# Patient Record
Sex: Female | Born: 1975 | Race: White | Hispanic: No | Marital: Single | Smoking: Current every day smoker
Health system: Southern US, Community
[De-identification: ages and names within clinical notes are randomized; demographics above are authoritative.]

## PROBLEM LIST (undated history)

## (undated) DIAGNOSIS — R569 Unspecified convulsions: Secondary | ICD-10-CM

## (undated) DIAGNOSIS — G809 Cerebral palsy, unspecified: Secondary | ICD-10-CM

---

## 2010-02-20 DIAGNOSIS — M2010 Hallux valgus (acquired), unspecified foot: Secondary | ICD-10-CM | POA: Insufficient documentation

## 2010-06-03 DIAGNOSIS — M545 Low back pain, unspecified: Secondary | ICD-10-CM | POA: Insufficient documentation

## 2010-10-24 DIAGNOSIS — R251 Tremor, unspecified: Secondary | ICD-10-CM | POA: Insufficient documentation

## 2010-11-06 DIAGNOSIS — Z889 Allergy status to unspecified drugs, medicaments and biological substances status: Secondary | ICD-10-CM | POA: Insufficient documentation

## 2010-11-06 DIAGNOSIS — M79671 Pain in right foot: Secondary | ICD-10-CM | POA: Insufficient documentation

## 2012-03-01 DIAGNOSIS — M47819 Spondylosis without myelopathy or radiculopathy, site unspecified: Secondary | ICD-10-CM | POA: Insufficient documentation

## 2012-07-02 DIAGNOSIS — R259 Unspecified abnormal involuntary movements: Secondary | ICD-10-CM | POA: Insufficient documentation

## 2012-07-02 DIAGNOSIS — G809 Cerebral palsy, unspecified: Secondary | ICD-10-CM | POA: Insufficient documentation

## 2012-07-02 DIAGNOSIS — M7710 Lateral epicondylitis, unspecified elbow: Secondary | ICD-10-CM | POA: Insufficient documentation

## 2012-07-02 DIAGNOSIS — M79609 Pain in unspecified limb: Secondary | ICD-10-CM | POA: Insufficient documentation

## 2012-07-02 DIAGNOSIS — M2550 Pain in unspecified joint: Secondary | ICD-10-CM | POA: Insufficient documentation

## 2012-07-02 DIAGNOSIS — M62838 Other muscle spasm: Secondary | ICD-10-CM | POA: Insufficient documentation

## 2013-03-01 DIAGNOSIS — Z3042 Encounter for surveillance of injectable contraceptive: Secondary | ICD-10-CM | POA: Insufficient documentation

## 2013-08-15 DIAGNOSIS — M25511 Pain in right shoulder: Secondary | ICD-10-CM | POA: Insufficient documentation

## 2013-12-05 DIAGNOSIS — Z5181 Encounter for therapeutic drug level monitoring: Secondary | ICD-10-CM | POA: Insufficient documentation

## 2013-12-05 DIAGNOSIS — Z79891 Long term (current) use of opiate analgesic: Secondary | ICD-10-CM

## 2013-12-05 DIAGNOSIS — F17200 Nicotine dependence, unspecified, uncomplicated: Secondary | ICD-10-CM | POA: Insufficient documentation

## 2014-03-15 DIAGNOSIS — M7581 Other shoulder lesions, right shoulder: Secondary | ICD-10-CM | POA: Insufficient documentation

## 2014-10-29 DIAGNOSIS — M2392 Unspecified internal derangement of left knee: Secondary | ICD-10-CM | POA: Insufficient documentation

## 2015-10-02 ENCOUNTER — Emergency Department (HOSPITAL_COMMUNITY)
Admission: EM | Admit: 2015-10-02 | Discharge: 2015-10-03 | Disposition: A | Discharge status: Home / Self Care | Payer: Medicare Other | Attending: Emergency Medicine | Admitting: Emergency Medicine

## 2015-10-02 ENCOUNTER — Emergency Department (HOSPITAL_COMMUNITY): Payer: Medicare Other

## 2015-10-02 ENCOUNTER — Encounter (HOSPITAL_COMMUNITY): Payer: Self-pay | Admitting: *Deleted

## 2015-10-02 DIAGNOSIS — M546 Pain in thoracic spine: Secondary | ICD-10-CM | POA: Diagnosis not present

## 2015-10-02 DIAGNOSIS — G809 Cerebral palsy, unspecified: Secondary | ICD-10-CM | POA: Diagnosis not present

## 2015-10-02 DIAGNOSIS — M79671 Pain in right foot: Secondary | ICD-10-CM | POA: Diagnosis not present

## 2015-10-02 HISTORY — DX: Unspecified convulsions: R56.9

## 2015-10-02 HISTORY — DX: Cerebral palsy, unspecified: G80.9

## 2015-10-02 MED ORDER — HYDROCODONE-ACETAMINOPHEN 5-325 MG PO TABS
1.0000 | ORAL_TABLET | Freq: Once | ORAL | Status: AC
  Administered 2015-10-02: 1 via ORAL
  Filled 2015-10-02: qty 1

## 2015-10-02 MED ORDER — HYDROCODONE-ACETAMINOPHEN 5-325 MG PO TABS: 1.0000 | ORAL_TABLET | ORAL | 0 refills | Status: DC | PRN

## 2015-10-02 NOTE — ED Provider Notes (Signed)
[facility]-EMERGENCY DEPT Provider Note   CSN: [id number] Arrival date & time: 10/02/15  2039     History   Chief Complaint Chief Complaint  Patient presents with  . Foot Pain  . Shoulder Pain   HPI  Sharon Hooper is an 40 y.o. female with history of cerebral palsy who presents to the ED for evaluation of right foot pain and left shoulder pain. She states the pain started today. She feels like the lateral dorsum of her foot is swollen. The pain is worse with weight bearing. Denies known injury or trauma. Denies numbness, weakness, or tingling. She states she does walk around a lot. Her shoulder hurts from her neck to her mid back. Also no known trauma. No fever or chills. She has taken her regularly scheduled baclofen and meloxicam with no relief.   Past Medical History:  Diagnosis Date  . Cerebral palsy (HCC)   . Seizures (HCC)     There are no active problems to display for this patient.   History reviewed. No pertinent surgical history.  OB History    No data available       Home Medications    Prior to Admission medications   Not on File    Family History No family history on file.  Social History Social History  Substance Use Topics  . Smoking status: Never Smoker  . Smokeless tobacco: Never Used  . Alcohol use No     Allergies   Penicillins and Tramadol   Review of Systems Review of Systems 10 Systems reviewed and are negative for acute change except as noted in the HPI.   Physical Exam Updated Vital Signs BP 110/72 (BP Location: Right Arm)   Pulse 70   Temp 98.5 F (36.9 C) (Oral)   Resp 18   SpO2 98%   Physical Exam  Constitutional: She is oriented to person, place, and time. No distress.  HENT:  Head: Atraumatic.  Right Ear: External ear normal.  Left Ear: External ear normal.  Nose: Nose normal.  Eyes: Conjunctivae are normal. No scleral icterus.  Cardiovascular: Normal rate and regular rhythm.   Pulmonary/Chest: Effort normal. No  respiratory distress.  Abdominal: She exhibits no distension.  Musculoskeletal:  Minimal lateral dorsal tenderness of right foot. No edema noted. FROM of ankle. 2+ DP Left trapezial tenderness No midline neck or back tenderness FROM of neck and BUE  Neurological: She is alert and oriented to person, place, and time.  Skin: Skin is warm and dry. She is not diaphoretic.  Psychiatric: She has a normal mood and affect. Her behavior is normal.  Nursing note and vitals reviewed.    ED Treatments / Results  Labs (all labs ordered are listed, but only abnormal results are displayed) Labs Reviewed - No data to display  EKG  EKG Interpretation None       Radiology Dg Foot Complete Right  Result Date: 10/02/2015 CLINICAL DATA:  Anterior foot pain for 48 hours. Lateral foot swelling. Prior fracture of the great toe. EXAM: RIGHT FOOT COMPLETE - 3+ VIEW COMPARISON:  None. FINDINGS: There is arthropathy at the first MTP joint which could conceivably be posttraumatic, but which has a pencil in cup appearance they can be associated with psoriatic arthropathy. No malalignment at the Lisfranc joint identified. Small bone island in the calcaneus. No additional bony abnormality identified. No foreign body or gas in the soft tissues noted. IMPRESSION: 1. Scalloped appearance of the base the proximal phalanx great toe with a  tapered and deformed appearance of the head of the first metatarsal. This could be posttraumatic although psoriatic arthropathy could cause a similar appearance. 2. No additional significant bony findings. No foreign body or gas in the soft tissues to further explain the patient's anterior foot pain. Electronically Signed   By: Gaylyn RongWalter  Liebkemann M.D.   On: 10/02/2015 23:20    Procedures Procedures (including critical care time)  Medications Ordered in ED Medications  HYDROcodone-acetaminophen (NORCO/VICODIN) 5-325 MG per tablet 1 tablet (1 tablet Oral Given 10/02/15 2337)      Initial Impression / Assessment and Plan / ED Course  I have reviewed the triage vital signs and the nursing notes.  Pertinent labs & imaging results that were available during my care of the patient were reviewed by me and considered in my medical decision making (see chart for details).  Clinical Course    Discussed XR findings with pt. Will give rx for short course of norco. Pt takes baclofen and mobic already. Instructed f/u with ortho. ER return precautions given. The patient appears reasonably screened and/or stabilized for discharge and I doubt any other medical condition or other [facility] requiring further screening, evaluation, or treatment in the ED at this time prior to discharge.   Final Clinical Impressions(s) / ED Diagnoses   Final diagnoses:  Right foot pain  Left-sided thoracic back pain    New Prescriptions Discharge Medication List as of 10/02/2015 11:27 PM    START taking these medications   Details  HYDROcodone-acetaminophen (NORCO/VICODIN) 5-325 MG tablet Take 1 tablet by mouth every 4 (four) hours as needed for severe pain., Starting Wed 10/02/2015, Print         Carlene CoriaSerena Y Allana Shrestha, PA-C 10/03/15 [id number]    Benjiman CoreNathan Pickering, MD 10/03/15 [phone]

## 2015-10-02 NOTE — ED Triage Notes (Signed)
Pt complains of left shoulder, right and right dorsal foot pain since yesterday. Pt denies injury, states she woke with the pain yesterday.

## 2015-10-02 NOTE — Discharge Instructions (Signed)
Take Norco as needed for severe pain. Take your home medications as prescribed. Follow up with orthopedics as soon as possible.

## 2015-12-17 DIAGNOSIS — H52222 Regular astigmatism, left eye: Secondary | ICD-10-CM | POA: Diagnosis not present

## 2015-12-17 DIAGNOSIS — H04123 Dry eye syndrome of bilateral lacrimal glands: Secondary | ICD-10-CM | POA: Diagnosis not present

## 2015-12-18 ENCOUNTER — Encounter: Payer: Self-pay | Admitting: Family Medicine

## 2015-12-18 ENCOUNTER — Ambulatory Visit (INDEPENDENT_AMBULATORY_CARE_PROVIDER_SITE_OTHER): Payer: Medicare Other | Admitting: Family Medicine

## 2015-12-18 VITALS — BP 110/70 | HR 68 | Temp 98.3°F | Resp 18 | Ht 60.0 in | Wt 147.6 lb

## 2015-12-18 DIAGNOSIS — B070 Plantar wart: Secondary | ICD-10-CM | POA: Diagnosis not present

## 2015-12-18 DIAGNOSIS — G8929 Other chronic pain: Secondary | ICD-10-CM

## 2015-12-18 DIAGNOSIS — Z3040 Encounter for surveillance of contraceptives, unspecified: Secondary | ICD-10-CM

## 2015-12-18 DIAGNOSIS — L84 Corns and callosities: Secondary | ICD-10-CM | POA: Diagnosis not present

## 2015-12-18 DIAGNOSIS — Z79891 Long term (current) use of opiate analgesic: Secondary | ICD-10-CM | POA: Diagnosis not present

## 2015-12-18 DIAGNOSIS — G809 Cerebral palsy, unspecified: Secondary | ICD-10-CM

## 2015-12-18 DIAGNOSIS — R6889 Other general symptoms and signs: Secondary | ICD-10-CM | POA: Diagnosis not present

## 2015-12-18 DIAGNOSIS — Z23 Encounter for immunization: Secondary | ICD-10-CM | POA: Diagnosis not present

## 2015-12-18 DIAGNOSIS — M7581 Other shoulder lesions, right shoulder: Secondary | ICD-10-CM | POA: Diagnosis not present

## 2015-12-18 DIAGNOSIS — Z3042 Encounter for surveillance of injectable contraceptive: Secondary | ICD-10-CM

## 2015-12-18 DIAGNOSIS — R251 Tremor, unspecified: Secondary | ICD-10-CM

## 2015-12-18 DIAGNOSIS — Z5181 Encounter for therapeutic drug level monitoring: Secondary | ICD-10-CM

## 2015-12-18 DIAGNOSIS — F17200 Nicotine dependence, unspecified, uncomplicated: Secondary | ICD-10-CM | POA: Diagnosis not present

## 2015-12-18 LAB — BASIC METABOLIC PANEL
BUN: 12 mg/dL (ref 7–25)
CHLORIDE: 108 mmol/L (ref 98–110)
CO2: 21 mmol/L (ref 20–31)
Calcium: 8.4 mg/dL — ABNORMAL LOW (ref 8.6–10.2)
Creat: 0.66 mg/dL (ref 0.50–1.10)
GLUCOSE: 81 mg/dL (ref 65–99)
POTASSIUM: 4.1 mmol/L (ref 3.5–5.3)
Sodium: 137 mmol/L (ref 135–146)

## 2015-12-18 LAB — CBC
HEMATOCRIT: 35.6 % (ref 35.0–45.0)
HEMOGLOBIN: 11.9 g/dL (ref 11.7–15.5)
MCH: 30.3 pg (ref 27.0–33.0)
MCHC: 33.4 g/dL (ref 32.0–36.0)
MCV: 90.6 fL (ref 80.0–100.0)
MPV: 11.2 fL (ref 7.5–12.5)
Platelets: 163 10*3/uL (ref 140–400)
RBC: 3.93 MIL/uL (ref 3.80–5.10)
RDW: 12.9 % (ref 11.0–15.0)
WBC: 5 10*3/uL (ref 3.8–10.8)

## 2015-12-18 LAB — TSH: TSH: 1.6 mIU/L

## 2015-12-18 MED ORDER — FLUCONAZOLE 150 MG PO TABS: 150.0000 mg | ORAL_TABLET | ORAL | 0 refills | Status: DC

## 2015-12-18 MED ORDER — BACLOFEN 10 MG PO TABS
10.0000 mg | ORAL_TABLET | Freq: Three times a day (TID) | ORAL | 1 refills | Status: DC
Start: 2015-12-18 — End: 2016-03-26

## 2015-12-18 MED ORDER — MELOXICAM 15 MG PO TABS: 15.0000 mg | ORAL_TABLET | Freq: Every day | ORAL | 1 refills | Status: DC

## 2015-12-18 MED ORDER — PROPRANOLOL HCL 80 MG PO TABS: 80.0000 mg | ORAL_TABLET | Freq: Every day | ORAL | 1 refills | Status: DC

## 2015-12-18 NOTE — Patient Instructions (Signed)
     IF you received an x-ray today, you will receive an invoice from [facility]. Please contact [facility] at [phone] with questions or concerns regarding your invoice.   IF you received labwork today, you will receive an invoice from [facility]. Please contact [facility] at [phone] with questions or concerns regarding your invoice.   Our billing staff will not be able to assist you with questions regarding bills from these companies.  You will be contacted with the lab results as soon as they are available. The fastest way to get your results is to activate your My Chart account. Instructions are located on the last page of this paperwork. If you have not heard from us regarding the results in 2 weeks, please contact this office.      

## 2015-12-18 NOTE — Progress Notes (Signed)
Chief Complaint  Patient presents with  . Medication Refill    Baclofen, propranolol, meloxicam    HPI   Pt reports that she smokes a pack of cigarettes a day for the past 21 years She reports that she is not ready to quit  She gets cigarettes by buying them or people offering She reports that she has been smoking since age 40  Cerebral Palsy and intention tremor She reports a chronic history of muscle spasms and multiple joint problems She was discharge from her previous pain clinic due to discrepancy in her urine screen She is currently taking baclofen and meloxicam for pain She would rate her pain 10/10 today Social support- she has a caregiver and a [employer] stream[employer] officer  Cold intolerance She reports that lately she has been feeling cold more than usual She reports that her tremors seems to be mild Her caregiver reports that the patient is always cold   Contraception She [facility] her [facility] She is up to date on her pap smear and std screenings   Past Medical History:  Diagnosis Date  . Cerebral palsy (HCC)   . Seizures (HCC)     Current Outpatient Prescriptions  Medication Sig Dispense Refill  . baclofen (LIORESAL) 10 MG tablet Take 1 tablet (10 mg total) by mouth 3 (three) times daily. 90 each 1  . meloxicam (MOBIC) 15 MG tablet Take 1 tablet (15 mg total) by mouth daily. 30 tablet 1  . propranolol (INDERAL) 80 MG tablet Take 1 tablet (80 mg total) by mouth daily. 90 tablet 1  . fluconazole (DIFLUCAN) 150 MG tablet Take 1 tablet (150 mg total) by mouth once a week. 2 tablet 0  . HYDROcodone-acetaminophen (NORCO/VICODIN) 5-325 MG tablet Take 1 tablet by mouth every 4 (four) hours as needed for severe pain. (Patient not taking: Reported on 12/18/2015) 10 tablet 0   No current facility-administered medications for this visit.     Allergies:  Allergies  Allergen Reactions  . Tramadol     Other reaction(s): Other Other reaction(s):  Other Per patient she states it counteracts with her meds//increases tremors  . Penicillins     Other reaction(s): VOMITING,NAUSEA    History reviewed. No pertinent surgical history.  Social History   Social History  . Marital status: Single    Spouse name: N/A  . Number of children: N/A  . Years of education: N/A   Social History Main Topics  . Smoking status: Current Every Day Smoker    Packs/day: 1.00    Years: 15.00    Types: Cigarettes  . Smokeless tobacco: Never Used  . Alcohol use 0.6 - 1.2 oz/week    1 - 2 Glasses of wine per week  . Drug use: No  . Sexual activity: Not Asked   Other Topics Concern  . None   Social History Narrative  . None    Review of Systems  Constitutional: Negative for chills and fever.  HENT: Negative for hearing loss and tinnitus.   Eyes: Negative for blurred vision and double vision.  Respiratory: Negative for cough and hemoptysis.   Cardiovascular: Negative for chest pain and palpitations.  Gastrointestinal: Negative for abdominal pain, nausea and vomiting.  Genitourinary: Negative for dysuria, frequency and urgency.  Musculoskeletal: Positive for back pain, falls and joint pain. Negative for neck pain.  Skin: Negative for itching and rash.  Neurological: Negative for dizziness, tingling and headaches.  Psychiatric/Behavioral: Negative for depression. The patient is not nervous/anxious.  Objective: Vitals:   12/18/15 0827  BP: 110/70  Pulse: 68  Resp: 18  Temp: 98.3 F (36.8 C)  TempSrc: Oral  SpO2: 99%  Weight: 147 lb 9.6 oz (67 kg)  Height: 5' (1.524 m)    Physical Exam  Constitutional: She is oriented to person, place, and time. She appears well-developed and well-nourished.  HENT:  Head: Normocephalic and atraumatic.  Right Ear: External ear normal.  Left Ear: External ear normal.  Mouth/Throat: Oropharynx is clear and moist.  Eyes: Conjunctivae and EOM are normal.  Neck: Normal range of motion. Neck  supple.  Cardiovascular: Normal rate, regular rhythm and normal heart sounds.   No murmur heard. Pulses:      Dorsalis pedis pulses are 2+ on the right side, and 2+ on the left side.       Posterior tibial pulses are 2+ on the right side, and 2+ on the left side.  Pulmonary/Chest: Effort normal and breath sounds normal. No respiratory distress. She has no wheezes.  Musculoskeletal:  Plantar wart on sole of right foot  Feet:  Right Foot:  Skin Integrity: Positive for callus. Negative for ulcer.  Left Foot:  Skin Integrity: Positive for callus. Negative for ulcer.  Neurological: She is alert and oriented to person, place, and time. She displays tremor and abnormal reflex. She exhibits abnormal muscle tone.  Reflex Scores:      Patellar reflexes are 3+ on the right side and 3+ on the left side. Skin: Skin is warm. Rash noted.  Hypopigmentation, discrete spots on arms, axillae, torso  Psychiatric: She has a normal mood and affect. Her behavior is normal. Judgment and thought content normal.    Assessment and Plan Sharon ClockKristina was seen today for medication refill.  Diagnoses and all orders for this visit:  Infantile cerebral palsy [facility](HCC)- discussed that she should continue her routine medication propranolol for tremors  Need for prophylactic vaccination and inoculation against influenza -     Flu Vaccine QUAD 36+ mos PF IM (Fluarix & Fluzone Quad PF)  Encounter for chronic pain management -     Ambulatory referral to Pain Clinic  Right rotator cuff tendonitis- continue meloxicam  Tobacco use disorder- advised smoking cessation Pt is not ready for change She is aware of the consequences  Intermittent tremor- discussed that we will check hemoglobin as anemia can worsen tremor -     CBC  Encounter for monitoring opioid maintenance therapy- referral to Pain Clinic Refilled Meloxicam today Did not refill opiate since she is not currently taking it Discussed that going without will  allow her to be restarted on a lower dose  Depo-Provera contraceptive status- continue Depo mgmt with [facility]  Need for Tdap vaccination- tdap given today  Cold intolerance- will screen for anemia or thyroid imbalance -     CBC -     Basic metabolic panel -     TSH  Callus of foot- since pt has spasticity she walks on the balls of her feet  Could benefit from some shoe inserts -     Ambulatory referral to Podiatry  Plantar wart- follow up with Podiatry, referral placed -     Ambulatory referral to Podiatry  Other orders -     Tdap vaccine greater than or equal to 7yo IM -     baclofen (LIORESAL) 10 MG tablet; Take 1 tablet (10 mg total) by mouth 3 (three) times daily. -     meloxicam (MOBIC) 15 MG tablet;  Take 1 tablet (15 mg total) by mouth daily. -     propranolol (INDERAL) 80 MG tablet; Take 1 tablet (80 mg total) by mouth daily. -     fluconazole (DIFLUCAN) 150 MG tablet; Take 1 tablet (150 mg total) by mouth once a week.     Aaban Griep A Butler Vegh

## 2015-12-30 ENCOUNTER — Ambulatory Visit (INDEPENDENT_AMBULATORY_CARE_PROVIDER_SITE_OTHER): Payer: Medicare Other

## 2015-12-30 ENCOUNTER — Ambulatory Visit (INDEPENDENT_AMBULATORY_CARE_PROVIDER_SITE_OTHER): Payer: Medicare Other | Admitting: Podiatry

## 2015-12-30 DIAGNOSIS — R52 Pain, unspecified: Secondary | ICD-10-CM

## 2015-12-30 DIAGNOSIS — M216X9 Other acquired deformities of unspecified foot: Secondary | ICD-10-CM

## 2015-12-30 DIAGNOSIS — M2010 Hallux valgus (acquired), unspecified foot: Secondary | ICD-10-CM | POA: Diagnosis not present

## 2015-12-30 DIAGNOSIS — M79673 Pain in unspecified foot: Secondary | ICD-10-CM

## 2015-12-30 DIAGNOSIS — Q828 Other specified congenital malformations of skin: Secondary | ICD-10-CM | POA: Diagnosis not present

## 2016-01-03 NOTE — Progress Notes (Signed)
Subjective:     Patient ID: Sharon Hooper, female   DOB: [birth year], 40 y.o.   MRN: [id number]  HPI 40 year old female percent the also concerns of possible warts/calluses to both of her feet on the ball of her foot. This been ongoing for several months and is painful with pressure in shoe gear. She does have a history of surgery to her left foot. She said that she had a broken bone that was fixed and then the corrected the bunion of that time as well. From the area of pain today is on the hyperkeratotic lesions. She has no other areas of tenderness. No recent injury or trauma. Denies stepping on any foreign objects. No other complaints today.  Review of Systems  All other systems reviewed and are negative.      Objective:   Physical Exam General: AAO x3, NAD  Dermatological: Submetatarsal 3 hyperkeratotic lesions bilaterally. Upon removing there is no underlying ulceration, drainage or any signs of infection. No evidence of foreign body. No pinpoint bleeding or evidence of verruca. No other open lesions or pre-ulcerative lesions identified today.  Vascular: Dorsalis Pedis artery and Posterior Tibial artery pedal pulses are 2/4 bilateral with immedate capillary fill time. There is no pain with calf compression, swelling, warmth, erythema.   Neruologic: Grossly intact via light touch bilateral. Vibratory intact via tuning fork bilateral. Protective threshold with Semmes Wienstein monofilament intact to all pedal sites bilateral.   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 The incision on the left foot is well-healed and the prior surgery. Hammertoe contractures are present. There is prominence of the metatarsal head plantarly with atrophy of the fat pad on the hyperkeratotic lesion. Muscular strength 5/5 in all groups tested bilateral.  Gait: Unassisted, Nonantalgic.      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 On the left side evidence of previous shave bunionectomy. No evidence of acute fracture. -Hyperkeratotic lesions debrided 2 without complications or bleeding. Area was cleaned and offloading pads were applied. On the left side area. Area appeared to be thicker. Salicylic acid was applied followed by a bandage. Post procedure instructions were discussed. Monitor for infection. -Offloading pads dispensed -Follow-up as scheduled or sooner if needed. Call any questions concerns the meantime.  Ovid CurdMatthew Almetta Liddicoat, DPM

## 2016-01-06 DIAGNOSIS — M47816 Spondylosis without myelopathy or radiculopathy, lumbar region: Secondary | ICD-10-CM | POA: Diagnosis not present

## 2016-01-06 DIAGNOSIS — M25519 Pain in unspecified shoulder: Secondary | ICD-10-CM | POA: Diagnosis not present

## 2016-01-06 DIAGNOSIS — M25569 Pain in unspecified knee: Secondary | ICD-10-CM | POA: Diagnosis not present

## 2016-01-06 DIAGNOSIS — Z79891 Long term (current) use of opiate analgesic: Secondary | ICD-10-CM | POA: Diagnosis not present

## 2016-01-06 DIAGNOSIS — Z79899 Other long term (current) drug therapy: Secondary | ICD-10-CM | POA: Diagnosis not present

## 2016-01-06 DIAGNOSIS — G894 Chronic pain syndrome: Secondary | ICD-10-CM | POA: Diagnosis not present

## 2016-01-08 ENCOUNTER — Encounter: Payer: Self-pay | Admitting: Family Medicine

## 2016-01-08 ENCOUNTER — Ambulatory Visit (INDEPENDENT_AMBULATORY_CARE_PROVIDER_SITE_OTHER): Payer: Medicare Other | Admitting: Family Medicine

## 2016-01-08 VITALS — BP 92/68 | HR 70 | Temp 98.9°F | Resp 16 | Ht 60.0 in | Wt 148.6 lb

## 2016-01-08 DIAGNOSIS — J069 Acute upper respiratory infection, unspecified: Secondary | ICD-10-CM

## 2016-01-08 DIAGNOSIS — F17200 Nicotine dependence, unspecified, uncomplicated: Secondary | ICD-10-CM

## 2016-01-08 DIAGNOSIS — H9192 Unspecified hearing loss, left ear: Secondary | ICD-10-CM | POA: Diagnosis not present

## 2016-01-08 MED ORDER — BENZONATATE 100 MG PO CAPS: 100.0000 mg | ORAL_CAPSULE | Freq: Two times a day (BID) | ORAL | 0 refills | Status: DC | PRN

## 2016-01-08 MED ORDER — FLUTICASONE PROPIONATE 50 MCG/ACT NA SUSP: 2.0000 | Freq: Every day | NASAL | 6 refills | Status: DC

## 2016-01-08 NOTE — Progress Notes (Signed)
Chief Complaint  Patient presents with  . Follow-up    coughx 1week     HPI   Pt reports that she has been having a cough for one week. No sick contacts.  Reports that it started out of the blue a week ago but is better each day. No fevers or chills She has white sputum that was previously green a few days ago She is taking teas for her cough No wheezing She has not cut down on her smoking despite her cough She is not ready to quit smoking  Past Medical History:  Diagnosis Date  . Cerebral palsy (HCC)   . Seizures (HCC)     Current Outpatient Prescriptions  Medication Sig Dispense Refill  . baclofen (LIORESAL) 10 MG tablet Take 1 tablet (10 mg total) by mouth 3 (three) times daily. 90 each 1  . fluconazole (DIFLUCAN) 150 MG tablet Take 1 tablet (150 mg total) by mouth once a week. 2 tablet 0  . HYDROcodone-acetaminophen (NORCO/VICODIN) 5-325 MG tablet Take 1 tablet by mouth every 4 (four) hours as needed for severe pain. 10 tablet 0  . meloxicam (MOBIC) 15 MG tablet Take 1 tablet (15 mg total) by mouth daily. 30 tablet 1  . propranolol (INDERAL) 80 MG tablet Take 1 tablet (80 mg total) by mouth daily. 90 tablet 1  . benzonatate (TESSALON) 100 MG capsule Take 1 capsule (100 mg total) by mouth 2 (two) times daily as needed for cough. 20 capsule 0  . fluticasone (FLONASE) 50 MCG/ACT nasal spray Place 2 sprays into both nostrils daily. 16 g 6   No current facility-administered medications for this visit.     Allergies:  Allergies  Allergen Reactions  . Tramadol     Other reaction(s): Other Other reaction(s): Other Per patient she states it counteracts with her meds//increases tremors  . Penicillins     Other reaction(s): VOMITING,NAUSEA    History reviewed. No pertinent surgical history.  Social History   Social History  . Marital status: Single    Spouse name: N/A  . Number of children: N/A  . Years of education: N/A   Social History Main Topics  . Smoking  status: Current Every Day Smoker    Packs/day: 1.00    Years: 15.00    Types: Cigarettes  . Smokeless tobacco: Never Used  . Alcohol use 0.6 - 1.2 oz/week    1 - 2 Glasses of wine per week  . Drug use: No  . Sexual activity: Not Asked   Other Topics Concern  . None   Social History Narrative  . None    Review of Systems  Constitutional: Negative for chills, fever and weight loss.  HENT: Positive for hearing loss. Negative for ear discharge, ear pain and tinnitus.   Eyes: Negative for blurred vision, double vision and photophobia.  Respiratory: Positive for sputum production. Negative for cough, shortness of breath and wheezing.   Cardiovascular: Negative for chest pain and palpitations.  Skin: Negative for itching and rash.  Neurological: Negative for dizziness and headaches.    Objective: Vitals:   01/08/16 1016  BP: 92/68  Pulse: 70  Resp: 16  Temp: 98.9 F (37.2 C)  TempSrc: Oral  SpO2: 99%  Weight: 148 lb 9.6 oz (67.4 kg)  Height: 5' (1.524 m)    Physical Exam General: alert, oriented, in NAD Head: normocephalic, atraumatic, no sinus tenderness Eyes: EOM intact, no scleral icterus or conjunctival injection Ears: small external canal. Unable to see TM  Throat: no pharyngeal exudate or erythema Lymph: no posterior auricular, submental or cervical lymph adenopathy Heart: normal rate, normal sinus rhythm, no murmurs Lungs: clear to auscultation bilaterally, no wheezing   Assessment and Plan Sharon ClockKristina was seen today for follow-up.  Diagnoses and all orders for this visit:  Decreased hearing of left ear- discussed that this may be a congenital issue or dysfunction of the membrane Will refer -     Ambulatory referral to ENT  Acute URI Supportive care Discussed viral vs. Bacterial etiology Advised zinc with vitamin C lozenge Smoking cessation advised -     benzonatate (TESSALON) 100 MG capsule; Take 1 capsule (100 mg total) by mouth 2 (two) times daily as  needed for cough. -     fluticasone (FLONASE) 50 MCG/ACT nasal spray; Place 2 sprays into both nostrils daily.    Sharon Hooper A Omesha Bowerman

## 2016-01-08 NOTE — Patient Instructions (Addendum)
   IF you received an x-ray today, you will receive an invoice from [facility]. Please contact [facility] at [phone] with questions or concerns regarding your invoice.   IF you received labwork today, you will receive an invoice from [facility]. Please contact [facility] at [phone] with questions or concerns regarding your invoice.   Our billing staff will not be able to assist you with questions regarding bills from these companies.  You will be contacted with the lab results as soon as they are available. The fastest way to get your results is to activate your My Chart account. Instructions are located on the last page of this paperwork. If you have not heard from us regarding the results in 2 weeks, please contact this office.     Upper Respiratory Infection, Adult Most upper respiratory infections (URIs) are a viral infection of the air passages leading to the lungs. A URI affects the nose, throat, and upper air passages. The most common type of URI is nasopharyngitis and is typically referred to as "the common cold." URIs run their course and usually go away on their own. Most of the time, a URI does not require medical attention, but sometimes a bacterial infection in the upper airways can follow a viral infection. This is called a secondary infection. Sinus and middle ear infections are common types of secondary upper respiratory infections. Bacterial pneumonia can also complicate a URI. A URI can worsen asthma and chronic obstructive pulmonary disease (COPD). Sometimes, these complications can require emergency medical care and may be life threatening. What are the causes? Almost all URIs are caused by viruses. A virus is a type of germ and can spread from one person to another. What increases the risk? You may be at risk for a URI if:  You smoke.  You have chronic heart or lung disease.  You have a weakened defense (immune)  system.  You are very young or very old.  You have nasal allergies or asthma.  You work in crowded or poorly ventilated areas.  You work in health care facilities or schools. What are the signs or symptoms? Symptoms typically develop 2-3 days after you come in contact with a cold virus. Most viral URIs last 7-10 days. However, viral URIs from the influenza virus (flu virus) can last 14-18 days and are typically more severe. Symptoms may include:  Runny or stuffy (congested) nose.  Sneezing.  Cough.  Sore throat.  Headache.  Fatigue.  Fever.  Loss of appetite.  Pain in your forehead, behind your eyes, and over your cheekbones (sinus pain).  Muscle aches. How is this diagnosed? Your health care provider may diagnose a URI by:  Physical exam.  Tests to check that your symptoms are not due to another condition such as:  Strep throat.  Sinusitis.  Pneumonia.  Asthma. How is this treated? A URI goes away on its own with time. It cannot be cured with medicines, but medicines may be prescribed or recommended to relieve symptoms. Medicines may help:  Reduce your fever.  Reduce your cough.  Relieve nasal congestion. Follow these instructions at home:  Take medicines only as directed by your health care provider.  Gargle warm saltwater or take cough drops to comfort your throat as directed by your health care provider.  Use a warm mist humidifier or inhale steam from a shower to increase air moisture. This may make it easier to breathe.  Drink enough fluid to keep your   urine clear or pale yellow.  Eat soups and other clear broths and maintain good nutrition.  Rest as needed.  Return to work when your temperature has returned to normal or as your health care provider advises. You may need to stay home longer to avoid infecting others. You can also use a face mask and careful hand washing to prevent spread of the virus.  Increase the usage of your inhaler if  you have asthma.  Do not use any tobacco products, including cigarettes, chewing tobacco, or electronic cigarettes. If you need help quitting, ask your health care provider. How is this prevented? The best way to protect yourself from getting a cold is to practice good hygiene.  Avoid oral or hand contact with people with cold symptoms.  Wash your hands often if contact occurs. There is no clear evidence that vitamin C, vitamin E, echinacea, or exercise reduces the chance of developing a cold. However, it is always recommended to get plenty of rest, exercise, and practice good nutrition. Contact a health care provider if:  You are getting worse rather than better.  Your symptoms are not controlled by medicine.  You have chills.  You have worsening shortness of breath.  You have brown or red mucus.  You have yellow or brown nasal discharge.  You have pain in your face, especially when you bend forward.  You have a fever.  You have swollen neck glands.  You have pain while swallowing.  You have white areas in the back of your throat. Get help right away if:  You have severe or persistent:  Headache.  Ear pain.  Sinus pain.  Chest pain.  You have chronic lung disease and any of the following:  Wheezing.  Prolonged cough.  Coughing up blood.  A change in your usual mucus.  You have a stiff neck.  You have changes in your:  Vision.  Hearing.  Thinking.  Mood. This information is not intended to replace advice given to you by your health care provider. Make sure you discuss any questions you have with your health care provider. Document Released: 07/08/2000 Document Revised: 09/15/2015 Document Reviewed: 04/19/2013 Elsevier Interactive Patient Education  2017 [employer]  

## 2016-01-28 DIAGNOSIS — H6123 Impacted cerumen, bilateral: Secondary | ICD-10-CM | POA: Diagnosis not present

## 2016-01-28 DIAGNOSIS — H9122 Sudden idiopathic hearing loss, left ear: Secondary | ICD-10-CM | POA: Diagnosis not present

## 2016-02-10 ENCOUNTER — Encounter: Payer: Self-pay | Admitting: Podiatry

## 2016-02-10 ENCOUNTER — Ambulatory Visit (INDEPENDENT_AMBULATORY_CARE_PROVIDER_SITE_OTHER): Payer: Medicare Other | Admitting: Podiatry

## 2016-02-10 DIAGNOSIS — Q828 Other specified congenital malformations of skin: Secondary | ICD-10-CM | POA: Diagnosis not present

## 2016-02-10 NOTE — Progress Notes (Signed)
Subjective: 41 year old female presents the office they for follow-up with vibration calluses to both of her feet. She states that since last appointment she is doing much better and she did walk better without significant pain. She's been using the metatarsal off and has been helping as well. She denies any swelling or redness. No complications after the last treatment. Denies any systemic complaints such as fevers, chills, nausea, vomiting. No acute changes since last appointment, and no other complaints at this time.   Objective: AAO x3, NAD DP/PT pulses palpable bilaterally, CRT less than 3 seconds Prominence the metatarsal heads plantarly. There is hyperkeratotic lesions bilateral some metatarsal 3. Upon debridement there is no underlying ulceration, drainage or any signs of infection. There is no other open lesions or pre-ulcer lesions identified today.  No pain with calf compression, swelling, warmth, erythema  Assessment: Porokeratosis bilaterally  Plan: -All treatment options discussed with the patient including all alternatives, risks, complications.  -Hyperkeratotic lesions were debrided 2 without complications or bleeding. Area was cleaned followed by a pad and salinocaine. Post procedure injections were discussed. Monitor for infection. -Continue metatarsal offloading pads and these were dispensed today again. Discussed custom orthotics. -Follow-up with symptoms not improve the last couple weeks or sooner if needed. Call any questions or concerns meantime.  Ovid CurdMatthew Wagoner, DPM

## 2016-02-13 ENCOUNTER — Emergency Department (HOSPITAL_COMMUNITY): Payer: Medicare Other

## 2016-02-13 ENCOUNTER — Emergency Department (HOSPITAL_COMMUNITY)
Admission: EM | Admit: 2016-02-13 | Discharge: 2016-02-13 | Disposition: A | Discharge status: Home / Self Care | Payer: Medicare Other | Attending: Emergency Medicine | Admitting: Emergency Medicine

## 2016-02-13 ENCOUNTER — Encounter (HOSPITAL_COMMUNITY): Payer: Self-pay

## 2016-02-13 DIAGNOSIS — L0291 Cutaneous abscess, unspecified: Secondary | ICD-10-CM

## 2016-02-13 DIAGNOSIS — N611 Abscess of the breast and nipple: Secondary | ICD-10-CM | POA: Diagnosis not present

## 2016-02-13 DIAGNOSIS — N6489 Other specified disorders of breast: Secondary | ICD-10-CM | POA: Diagnosis not present

## 2016-02-13 DIAGNOSIS — F17210 Nicotine dependence, cigarettes, uncomplicated: Secondary | ICD-10-CM | POA: Insufficient documentation

## 2016-02-13 DIAGNOSIS — N644 Mastodynia: Secondary | ICD-10-CM | POA: Diagnosis present

## 2016-02-13 LAB — BASIC METABOLIC PANEL
ANION GAP: 6 (ref 5–15)
BUN: 13 mg/dL (ref 6–20)
CALCIUM: 8.8 mg/dL — AB (ref 8.9–10.3)
CO2: 25 mmol/L (ref 22–32)
Chloride: 109 mmol/L (ref 101–111)
Creatinine, Ser: 0.63 mg/dL (ref 0.44–1.00)
GLUCOSE: 86 mg/dL (ref 65–99)
POTASSIUM: 4 mmol/L (ref 3.5–5.1)
SODIUM: 140 mmol/L (ref 135–145)

## 2016-02-13 LAB — CBC
HCT: 37 % (ref 36.0–46.0)
Hemoglobin: 12.1 g/dL (ref 12.0–15.0)
MCH: 29.8 pg (ref 26.0–34.0)
MCHC: 32.7 g/dL (ref 30.0–36.0)
MCV: 91.1 fL (ref 78.0–100.0)
PLATELETS: 155 10*3/uL (ref 150–400)
RBC: 4.06 MIL/uL (ref 3.87–5.11)
RDW: 12.9 % (ref 11.5–15.5)
WBC: 7.1 10*3/uL (ref 4.0–10.5)

## 2016-02-13 MED ORDER — MORPHINE SULFATE (PF) 4 MG/ML IV SOLN
4.0000 mg | Freq: Once | INTRAVENOUS | Status: AC
  Administered 2016-02-13: 4 mg via INTRAVENOUS
  Filled 2016-02-13: qty 1

## 2016-02-13 MED ORDER — ONDANSETRON HCL 4 MG/2ML IJ SOLN
4.0000 mg | Freq: Once | INTRAMUSCULAR | Status: AC
  Administered 2016-02-13: 4 mg via INTRAVENOUS
  Filled 2016-02-13: qty 2

## 2016-02-13 MED ORDER — HYDROCODONE-ACETAMINOPHEN 5-325 MG PO TABS: 1.0000 | ORAL_TABLET | ORAL | 0 refills | Status: DC | PRN

## 2016-02-13 MED ORDER — CEPHALEXIN 500 MG PO CAPS: 500.0000 mg | ORAL_CAPSULE | Freq: Four times a day (QID) | ORAL | 0 refills | Status: DC

## 2016-02-13 NOTE — ED Triage Notes (Signed)
Pt c/o red and swollen area on under side of left breast. Pt endorse pain, red, and swollen area. Pt denies n/v, chills, or fever.

## 2016-02-13 NOTE — ED Provider Notes (Signed)
[facility]PT Provider Note   CSN: [id number] Arrival date & time: 02/13/16  1016     History   Chief Complaint Chief Complaint  Patient presents with  . Breast Pain    HPI Sharon Hooper is a 41 y.o. female with PMH of CP and Seizures. 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 The patient had a recurrence of infection. Back in March 2017 which was treated without incident. Incision and drainage and patient improved with Bactrim. She comes into the ER today with complaint of one week of worsening pain in the left breast. She describes a hot, red, painful area on the medial underside of the breast. This is the same place where she has had recurrent infections before. She denies nipple discharge, changes in the skin or breast structure. The patient denies fevers or chills. She is a current daily smoker.  HPI  Past Medical History:  Diagnosis Date  . Cerebral palsy (HCC)   . Seizures [facility](HCC)     Patient Active Problem List   Diagnosis Date Noted  . 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 Right rotator cuff tendonitis 03/15/2014  . Tobacco use disorder 12/05/2013  . Encounter for monitoring opioid maintenance therapy 12/05/2013  . Right shoulder pain 08/15/2013  . Depo-Provera contraceptive status 03/01/2013  . Abnormal involuntary movement 07/02/2012  . Spasm of muscle 07/02/2012  . Pain in soft tissues of limb 07/02/2012  . Pain in joints 07/02/2012  . Lateral epicondylitis of elbow 07/02/2012  . Infantile cerebral palsy (HCC) 07/02/2012  . 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 Facet arthropathy 03/01/2012  . Headache 05/11/2011  . History of seasonal allergies 11/06/2010  . Right foot pain 11/06/2010  . Intermittent tremor 10/24/2010  . Low back pain 06/03/2010  . Acquired hallux valgus 02/20/2010  . Hallux valgus, acquired 02/20/2010    History reviewed. No pertinent surgical history.  OB History     No data available       Home Medications    Prior to Admission medications   Medication Sig Start Date End Date Taking? Authorizing Provider  baclofen (LIORESAL) 10 MG tablet Take 1 tablet (10 mg total) by mouth 3 (three) times daily. 12/18/15   Doristine BosworthZoe A Stallings, MD  benzonatate (TESSALON) 100 MG capsule Take 1 capsule (100 mg total) by mouth 2 (two) times daily as needed for cough. 01/08/16   Doristine BosworthZoe A Stallings, MD  fluconazole (DIFLUCAN) 150 MG tablet Take 1 tablet (150 mg total) by mouth once a week. 12/18/15   Doristine BosworthZoe A Stallings, MD  fluticasone (FLONASE) 50 MCG/ACT nasal spray Place 2 sprays into both nostrils daily. 01/08/16   Doristine BosworthZoe A Stallings, MD  HYDROcodone-acetaminophen (NORCO/VICODIN) 5-325 MG tablet Take 1 tablet by mouth every 4 (four) hours as needed for severe pain. 10/02/15   Ace GinsSerena Y Sam, PA-C  meloxicam (MOBIC) 15 MG tablet Take 1 tablet (15 mg total) by mouth daily. 12/18/15   Doristine BosworthZoe A Stallings, MD  propranolol (INDERAL) 80 MG tablet Take 1 tablet (80 mg total) by mouth daily. 12/18/15   Doristine BosworthZoe A Stallings, MD    Family History No family history on file.  Social History Social History  Substance Use Topics  . Smoking status: Current Every Day Smoker    Packs/day: 1.00    Years: 15.00    Types: Cigarettes  . Smokeless tobacco: Never Used  . Alcohol use 0.6 - 1.2 oz/week    1 -  2 Glasses of wine per week     Allergies   Tramadol and Penicillins   Review of Systems Review of Systems Ten systems reviewed and are negative for acute change, except as noted in the HPI.    Physical Exam Updated Vital Signs BP 115/87 (BP Location: Right Arm)   Pulse 75   Temp 99.6 F (37.6 C) (Oral)   Resp 18   Ht 5' (1.524 m)   Wt 65.8 kg   SpO2 98%   BMI 28.32 kg/m   Physical Exam  Constitutional: She is oriented to person, place, and time. She appears well-developed and well-nourished. No distress.  HENT:  Head: Normocephalic and atraumatic.  Eyes: Conjunctivae are normal.  No scleral icterus.  Neck: Normal range of motion.  Cardiovascular: Normal rate, regular rhythm and normal heart sounds.  Exam reveals no gallop and no friction rub.   No murmur heard. Pulmonary/Chest: Effort normal and breath sounds normal. No respiratory distress.    Abdominal: Soft. Bowel sounds are normal. She exhibits no distension and no mass. There is no tenderness. There is no guarding.  Neurological: She is alert and oriented to person, place, and time.  Skin: Skin is warm and dry. She is not diaphoretic.  Nursing note and vitals reviewed.    ED Treatments / Results  Labs (all labs ordered are listed, but only abnormal results are displayed) Labs Reviewed - No data to display  EKG  EKG Interpretation None       Radiology No results found.  Procedures Procedures (including critical care time)  Medications Ordered in ED Medications - No data to display   Initial Impression / Assessment and Plan / ED Course  I have reviewed the triage vital signs and the nursing notes.  Pertinent labs & imaging results that were available during my care of the patient were reviewed by me and considered in my medical decision making (see chart for details).  Clinical Course as of Feb 14 656  Thu Feb 13, 2016  1535 Patient's [place] has not yet been read. I have reviewed the images and there appears to be a fluid collection. I have placed a call to surgery.  [AH]  1630 [facility] reviewed- the patient had 30 days of norco on 01/10/2016. She was released form the pain mgmt group because of no show. She will be given a small amount norco  [AH]    Clinical Course User Index [AH] Arthor Captain, PA-C   Patient with abscess of the left breast (recurrent). She will be seen tomorrow at 2:30 PM for aspiration at the breast center. Pain meds and keflex at dc. Discussed return precautions.  Final Clinical Impressions(s) / ED Diagnoses   Final diagnoses:  Abscess  Breast abscess    New  Prescriptions New Prescriptions   No medications on file     Arthor Captain, PA-C 02/14/16 1610    Doug Sou, MD 02/17/16 1757

## 2016-02-13 NOTE — Discharge Instructions (Signed)
Contact a health care provider if: °You have more redness, swelling, or pain around your abscess. °You have more fluid or blood coming from your abscess. °Your abscess feels warm to the touch. °You have more pus or a bad smell coming from your abscess. °You have a fever. °You have muscle aches. °You have chills or a general ill feeling. °Get help right away if: °You have severe pain. °You see red streaks on your skin spreading away from the abscess. °

## 2016-02-13 NOTE — ED Notes (Signed)
Patient transported to Ultrasound 

## 2016-02-13 NOTE — ED Provider Notes (Signed)
Patient with painful swollen at medial lower quadrant of left breast for one week. On exam this approximate 2 cm area of redness and tenderness at medial lower quadrant of breast. Nipple appears normal.   Doug SouSam Rmani Kellogg, MD 02/13/16 1651

## 2016-02-14 ENCOUNTER — Inpatient Hospital Stay: Admission: RE | Admit: 2016-02-14 | Payer: Medicare Other | Source: Ambulatory Visit

## 2016-02-14 ENCOUNTER — Ambulatory Visit
Admission: RE | Admit: 2016-02-14 | Discharge: 2016-02-14 | Disposition: A | Discharge status: Home / Self Care | Payer: Medicare Other | Source: Ambulatory Visit | Attending: Emergency Medicine | Admitting: Emergency Medicine

## 2016-02-15 ENCOUNTER — Encounter (HOSPITAL_COMMUNITY): Payer: Self-pay | Admitting: Emergency Medicine

## 2016-02-15 DIAGNOSIS — F17210 Nicotine dependence, cigarettes, uncomplicated: Secondary | ICD-10-CM | POA: Diagnosis not present

## 2016-02-15 DIAGNOSIS — Z79899 Other long term (current) drug therapy: Secondary | ICD-10-CM | POA: Diagnosis not present

## 2016-02-15 DIAGNOSIS — N611 Abscess of the breast and nipple: Secondary | ICD-10-CM | POA: Insufficient documentation

## 2016-02-15 NOTE — ED Triage Notes (Signed)
Pt. reports skin abscess at left lower breast fold with swelling onset this week , she was seen here 2 days ago prescribed with keflex and Vicodin , denies fever or chills.

## 2016-02-16 ENCOUNTER — Emergency Department (HOSPITAL_COMMUNITY)
Admission: EM | Admit: 2016-02-16 | Discharge: 2016-02-16 | Disposition: A | Discharge status: Home / Self Care | Payer: Medicare Other | Attending: Emergency Medicine | Admitting: Emergency Medicine

## 2016-02-16 DIAGNOSIS — L0291 Cutaneous abscess, unspecified: Secondary | ICD-10-CM

## 2016-02-16 DIAGNOSIS — N611 Abscess of the breast and nipple: Secondary | ICD-10-CM | POA: Diagnosis not present

## 2016-02-16 LAB — URINALYSIS, ROUTINE W REFLEX MICROSCOPIC
BILIRUBIN URINE: NEGATIVE
Glucose, UA: NEGATIVE mg/dL
HGB URINE DIPSTICK: NEGATIVE
Ketones, ur: NEGATIVE mg/dL
NITRITE: NEGATIVE
PROTEIN: NEGATIVE mg/dL
Specific Gravity, Urine: 1.009 (ref 1.005–1.030)
pH: 6 (ref 5.0–8.0)

## 2016-02-16 LAB — BASIC METABOLIC PANEL
ANION GAP: 10 (ref 5–15)
BUN: 12 mg/dL (ref 6–20)
CALCIUM: 8.8 mg/dL — AB (ref 8.9–10.3)
CO2: 21 mmol/L — AB (ref 22–32)
Chloride: 105 mmol/L (ref 101–111)
Creatinine, Ser: 0.63 mg/dL (ref 0.44–1.00)
GFR calc Af Amer: >60 mL/min (ref >60)
GFR calc non Af Amer: >60 mL/min (ref >60)
GLUCOSE: 99 mg/dL (ref 65–99)
Potassium: 4 mmol/L (ref 3.5–5.1)
Sodium: 136 mmol/L (ref 135–145)

## 2016-02-16 LAB — CBC WITH DIFFERENTIAL/PLATELET
BASOS ABS: 0 10*3/uL (ref 0.0–0.1)
Basophils Relative: 0 %
Eosinophils Absolute: 0.5 10*3/uL (ref 0.0–0.7)
Eosinophils Relative: 5 %
HEMATOCRIT: 34.8 % — AB (ref 36.0–46.0)
Hemoglobin: 11.7 g/dL — ABNORMAL LOW (ref 12.0–15.0)
LYMPHS ABS: 2.2 10*3/uL (ref 0.7–4.0)
LYMPHS PCT: 22 %
MCH: 30.2 pg (ref 26.0–34.0)
MCHC: 33.6 g/dL (ref 30.0–36.0)
MCV: 89.7 fL (ref 78.0–100.0)
MONO ABS: 0.9 10*3/uL (ref 0.1–1.0)
Monocytes Relative: 9 %
NEUTROS ABS: 6.5 10*3/uL (ref 1.7–7.7)
Neutrophils Relative %: 64 %
Platelets: 178 10*3/uL (ref 150–400)
RBC: 3.88 MIL/uL (ref 3.87–5.11)
RDW: 12.6 % (ref 11.5–15.5)
WBC: 10.2 10*3/uL (ref 4.0–10.5)

## 2016-02-16 LAB — I-STAT BETA HCG BLOOD, ED (MC, WL, AP ONLY): I-stat hCG, quantitative: <5 m[IU]/mL (ref <5)

## 2016-02-16 MED ORDER — OXYCODONE-ACETAMINOPHEN 5-325 MG PO TABS
2.0000 | ORAL_TABLET | Freq: Once | ORAL | Status: AC
  Administered 2016-02-16: 2 via ORAL
  Filled 2016-02-16: qty 2

## 2016-02-16 MED ORDER — CLINDAMYCIN HCL 150 MG PO CAPS
300.0000 mg | ORAL_CAPSULE | Freq: Once | ORAL | Status: AC
  Administered 2016-02-16: 300 mg via ORAL
  Filled 2016-02-16: qty 2

## 2016-02-16 MED ORDER — CLINDAMYCIN HCL 300 MG PO CAPS: 300.0000 mg | ORAL_CAPSULE | Freq: Three times a day (TID) | ORAL | 0 refills | Status: DC

## 2016-02-16 NOTE — ED Notes (Signed)
Patient Alert and oriented X4. Stable and ambulatory. Patient verbalized understanding of the discharge instructions.  Patient belongings were taken by the patient.  

## 2016-02-16 NOTE — ED Provider Notes (Signed)
[facility] Provider Note   CSN: [id number] Arrival date & time: 02/15/16  2341   By signing my name below, I, Clarisse Gouge, attest that this documentation has been prepared under the direction and in the presence of Tomasita Crumble, MD. Electronically signed, Clarisse Gouge, ED Scribe. 02/16/16. 2:50 AM.   History   Chief Complaint Chief Complaint  Patient presents with  . Abscess    Left Breast   The history is provided by the patient and medical records. No language interpreter was used.    HPI Comments: Sharon Hooper is a 41 y.o. female with Hx of multiple abscesses who presents to the Emergency Department complaining of a recurring abscess on the left breast that has gradually returned x 1 week. She reports associated drainage. Notes she was given pain medications, [place], and Rx for abx during her last visit to [facility] ED on 02/13/2016, but her abscess was not drained. Further notes she has not filled her prescription yet and she missed her F/U appointment scheduled for 02/14/2016.  She was not able to do this because her father is upstairs admitted and she was more worried about him.  Past Medical History:  Diagnosis Date  . Cerebral palsy (HCC)   . Seizures [facility])     Patient Active Problem List   Diagnosis Date Noted  . 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 Right rotator cuff tendonitis 03/15/2014  . Tobacco use disorder 12/05/2013  . Encounter for monitoring opioid maintenance therapy 12/05/2013  . Right shoulder pain 08/15/2013  . Depo-Provera contraceptive status 03/01/2013  . Abnormal involuntary movement 07/02/2012  . Spasm of muscle 07/02/2012  . Pain in soft tissues of limb 07/02/2012  . Pain in joints 07/02/2012  . Lateral epicondylitis of elbow 07/02/2012  . Infantile cerebral palsy (HCC) 07/02/2012  . 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 Facet arthropathy 03/01/2012  . Headache 05/11/2011  . History of seasonal allergies 11/06/2010    . Right foot pain 11/06/2010  . Intermittent tremor 10/24/2010  . Low back pain 06/03/2010  . Acquired hallux valgus 02/20/2010  . Hallux valgus, acquired 02/20/2010    History reviewed. No pertinent surgical history.  OB History    No data available       Home Medications    Prior to Admission medications   Medication Sig Start Date End Date Taking? Authorizing Provider  baclofen (LIORESAL) 10 MG tablet Take 1 tablet (10 mg total) by mouth 3 (three) times daily. 12/18/15   Doristine Bosworth, MD  cephALEXin (KEFLEX) 500 MG capsule Take 1 capsule (500 mg total) by mouth 4 (four) times daily. 02/13/16   Arthor Captain, PA-C  fluticasone (FLONASE) 50 MCG/ACT nasal spray Place 2 sprays into both nostrils daily. Patient not taking: Reported on 02/13/2016 01/08/16   Doristine Bosworth, MD  HYDROcodone-acetaminophen (NORCO) 5-325 MG tablet Take 1-2 tablets by mouth every 4 (four) hours as needed. 02/13/16   Arthor Captain, PA-C  meloxicam (MOBIC) 15 MG tablet Take 1 tablet (15 mg total) by mouth daily. 12/18/15   Doristine Bosworth, MD  propranolol (INDERAL) 80 MG tablet Take 1 tablet (80 mg total) by mouth daily. 12/18/15   Doristine Bosworth, MD    Family History No family history on file.  Social History Social History  Substance Use Topics  . Smoking status: Current Every Day Smoker    Packs/day: 1.00    Years: 15.00    Types: Cigarettes  . Smokeless tobacco: Never Used  .  Alcohol use 0.6 - 1.2 oz/week    1 - 2 Glasses of wine per week     Allergies   Tramadol and Penicillins   Review of Systems Review of Systems A complete 10 system review of systems was obtained and all systems are negative except as noted in the HPI and PMH.    Physical Exam Updated Vital Signs BP 107/58 (BP Location: Left Arm)   Pulse 86   Temp 99.2 F (37.3 C) (Oral)   Resp 16   Ht 5' (1.524 m)   Wt 148 lb 3.2 oz (67.2 kg)   SpO2 99%   BMI 28.94 kg/m   Physical Exam  Constitutional: She is  oriented to person, place, and time. She appears well-developed and well-nourished. No distress.  HENT:  Head: Normocephalic and atraumatic.  Nose: Nose normal.  Mouth/Throat: Oropharynx is clear and moist. No oropharyngeal exudate.  Eyes: Conjunctivae and EOM are normal. Pupils are equal, round, and reactive to light. No scleral icterus.  Neck: Normal range of motion. Neck supple. No JVD present. No tracheal deviation present. No thyromegaly present.  Cardiovascular: Normal rate, regular rhythm and normal heart sounds.  Exam reveals no gallop and no friction rub.   No murmur heard. Pulmonary/Chest: Effort normal and breath sounds normal. No respiratory distress. She has no wheezes. She exhibits no tenderness.  Abdominal: Soft. Bowel sounds are normal. She exhibits no distension and no mass. There is no tenderness. There is no rebound and no guarding.  Musculoskeletal: Normal range of motion. She exhibits no edema or tenderness.  Lymphadenopathy:    She has no cervical adenopathy.  Neurological: She is alert and oriented to person, place, and time. No cranial nerve deficit. She exhibits normal muscle tone.  Skin: Skin is warm and dry. No rash noted. There is erythema. No pallor.  2 cm abscess under the left breast with fluctuance and 1 cm surrounding area of induration and fluctuance; warmth and TTP noted to the aforementioned area.  Nursing note and vitals reviewed.    ED Treatments / Results  DIAGNOSTIC STUDIES: Oxygen Saturation is 99% on RA, normal by my interpretation.    COORDINATION OF CARE: 2:50 AM Discussed treatment plan with pt at bedside and pt agreed to plan.  Labs (all labs ordered are listed, but only abnormal results are displayed) Labs Reviewed  CBC WITH DIFFERENTIAL/PLATELET - Abnormal; Notable for the following:       Result Value   Hemoglobin 11.7 (*)    HCT 34.8 (*)    All other components within normal limits  BASIC METABOLIC PANEL - Abnormal; Notable for the  following:    CO2 21 (*)    Calcium 8.8 (*)    All other components within normal limits  URINALYSIS, ROUTINE W REFLEX MICROSCOPIC - Abnormal; Notable for the following:    APPearance HAZY (*)    Leukocytes, UA SMALL (*)    Bacteria, UA RARE (*)    Squamous Epithelial / LPF 6-30 (*)    All other components within normal limits  I-STAT BETA HCG BLOOD, ED ([facility], [facility], [facility] ONLY)    EKG  EKG Interpretation None       Radiology No results found.  Procedures Procedures (including critical care time)  Medications Ordered in ED Medications - No data to display   Initial Impression / Assessment and Plan / ED Course  I have reviewed the triage vital signs and the nursing notes.  Pertinent labs & imaging results that were  available during my care of the patient were reviewed by me and considered in my medical decision making (see chart for details).    Patient presents to the ED for abscess that has gotten worse.  It comes to an obvious head and I performed I&D.  Copius pus returned.  She is allergic to PCN but was given keflex.  Will switch to clinidamycin. She was advised not to fill prior Rx.  She was given percocet for pain. She appears well and in NAD. VS remain within her normal limits and she is safe for DC.    INCISION AND DRAINAGE Performed by: Tomasita Crumble Consent: Verbal consent obtained. Risks and benefits: risks, benefits and alternatives were discussed Type: abscess  Body area: under L breast  Anesthesia: local infiltration  Incision was made with a scalpel.   Complexity: complex Blunt dissection to break up loculations  Drainage: purulent  Drainage amount: 20cc  Patient tolerance: Patient tolerated the procedure well with no immediate complications.     I personally performed the services described in this documentation, which was scribed in my presence. The recorded information has been reviewed and is accurate.     Final Clinical Impressions(s) / ED  Diagnoses   Final diagnoses:  None    New Prescriptions New Prescriptions   No medications on file     Tomasita Crumble, MD 02/16/16 [phone]

## 2016-03-04 ENCOUNTER — Inpatient Hospital Stay: Admission: RE | Admit: 2016-03-04 | Payer: Medicare Other | Source: Ambulatory Visit

## 2016-03-04 ENCOUNTER — Other Ambulatory Visit: Payer: Medicare Other

## 2016-03-06 ENCOUNTER — Ambulatory Visit (INDEPENDENT_AMBULATORY_CARE_PROVIDER_SITE_OTHER): Payer: Medicare Other

## 2016-03-06 ENCOUNTER — Ambulatory Visit (INDEPENDENT_AMBULATORY_CARE_PROVIDER_SITE_OTHER): Payer: Medicare Other | Admitting: Physician Assistant

## 2016-03-06 VITALS — BP 110/60 | HR 75 | Temp 98.3°F | Resp 16 | Ht 60.0 in | Wt 154.0 lb

## 2016-03-06 DIAGNOSIS — G809 Cerebral palsy, unspecified: Secondary | ICD-10-CM | POA: Diagnosis not present

## 2016-03-06 DIAGNOSIS — M545 Low back pain, unspecified: Secondary | ICD-10-CM

## 2016-03-06 DIAGNOSIS — S8991XA Unspecified injury of right lower leg, initial encounter: Secondary | ICD-10-CM | POA: Diagnosis not present

## 2016-03-06 DIAGNOSIS — Z789 Other specified health status: Secondary | ICD-10-CM

## 2016-03-06 DIAGNOSIS — G8929 Other chronic pain: Secondary | ICD-10-CM | POA: Diagnosis not present

## 2016-03-06 DIAGNOSIS — M25561 Pain in right knee: Secondary | ICD-10-CM | POA: Diagnosis not present

## 2016-03-06 DIAGNOSIS — Z309 Encounter for contraceptive management, unspecified: Secondary | ICD-10-CM

## 2016-03-06 LAB — POCT URINE PREGNANCY: Preg Test, Ur: NEGATIVE

## 2016-03-06 MED ORDER — METHYLPREDNISOLONE ACETATE 80 MG/ML IJ SUSP
80.0000 mg | Freq: Once | INTRAMUSCULAR | Status: AC
  Administered 2016-03-06: 80 mg via INTRAMUSCULAR

## 2016-03-06 NOTE — Patient Instructions (Signed)
Please ice the area three times per day for 15 minutes.   Please make an appointment with Dr. Katrinka BlazingSmith for a possible refill in the meantime.   In the meantime, I will send the referral to pain management.   You can continue to use the meloxicam for pain.

## 2016-03-06 NOTE — Progress Notes (Signed)
[facility] [address] [place] [address] [phone]  Date:  03/06/2016   Name:  Sharon Hooper   DOB:  1975-12-30   MRN:  [id number]  PCP:  Elveria Rising, DO   No LMP recorded (lmp unknown). Patient has had an injection.   History of Present Illness:  Sharon Hooper is a 40 y.o. female patient who presents to [facility] for cc of depo shot and for fall. To note, patient initially was fast tracked for depo shot, but discovered that this injection was mainly given at the health department, and would need to be initially evaluated by a provider. Patient stepped off bus 2 days ago, and slipped, directly hitting her right hip on the ground.  She was able to get up and ambulate, but reports some pain to the right hip and right knee.  She had no swelling or bruising that she noticed.  She has no instability.   She would also like to request a refill on her opiate medication.  She is generally followed by pain management but states that she was kicked out of this practice.  This is to treat her chronic pain with cerebral palsy. October 19.  Patient Active Problem List   Diagnosis Date Noted  . 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 Right rotator cuff tendonitis 03/15/2014  . Tobacco use disorder 12/05/2013  . Encounter for monitoring opioid maintenance therapy 12/05/2013  . Right shoulder pain 08/15/2013  . Depo-Provera contraceptive status 03/01/2013  . Abnormal involuntary movement 07/02/2012  . Spasm of muscle 07/02/2012  . Pain in soft tissues of limb 07/02/2012  . Pain in joints 07/02/2012  . Lateral epicondylitis of elbow 07/02/2012  . Infantile cerebral palsy (HCC) 07/02/2012  . 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 Facet arthropathy 03/01/2012  . Headache 05/11/2011  . History of seasonal allergies 11/06/2010  . Right foot pain 11/06/2010  . Intermittent tremor 10/24/2010  . Low back pain 06/03/2010  . Acquired hallux  valgus 02/20/2010  . Hallux valgus, acquired 02/20/2010    Past Medical History:  Diagnosis Date  . Cerebral palsy (HCC)   . Seizures (HCC)     History reviewed. No pertinent surgical history.  Social History  Substance Use Topics  . Smoking status: Current Every Day Smoker    Packs/day: 1.00    Years: 15.00    Types: Cigarettes  . Smokeless tobacco: Never Used  . Alcohol use 0.6 - 1.2 oz/week    1 - 2 Glasses of wine per week    History reviewed. No pertinent family history.  Allergies  Allergen Reactions  . Tramadol     Other reaction(s): Other Other reaction(s): Other Per patient she states it counteracts with her meds//increases tremors  . Penicillins Nausea And Vomiting    Has patient had a PCN reaction causing immediate rash, facial/tongue/throat swelling, SOB or lightheadedness with hypotension: No Has patient had a PCN reaction causing severe rash involving mucus membranes or skin necrosis: No Has patient had a PCN reaction that required hospitalization No Has patient had a PCN reaction occurring within the last 10 years: No If all of the above answers are "NO", then may proceed with Cephalosporin use.     Medication list has been reviewed and updated.  Current Outpatient Prescriptions on File Prior to Visit  Medication Sig Dispense Refill  . baclofen (LIORESAL) 10 MG tablet Take 1 tablet (10 mg total) by mouth 3 (three) times daily. 90  each 1  . meloxicam (MOBIC) 15 MG tablet Take 1 tablet (15 mg total) by mouth daily. 30 tablet 1  . propranolol (INDERAL) 80 MG tablet Take 1 tablet (80 mg total) by mouth daily. 90 tablet 1  . cephALEXin (KEFLEX) 500 MG capsule Take 1 capsule (500 mg total) by mouth 4 (four) times daily. (Patient not taking: Reported on 03/06/2016) 20 capsule 0  . clindamycin (CLEOCIN) 300 MG capsule Take 1 capsule (300 mg total) by mouth 3 (three) times daily. X 7 days (Patient not taking: Reported on 03/06/2016) 21 capsule 0  . fluticasone  (FLONASE) 50 MCG/ACT nasal spray Place 2 sprays into both nostrils daily. (Patient not taking: Reported on 02/13/2016) 16 g 6  . HYDROcodone-acetaminophen (NORCO) 5-325 MG tablet Take 1-2 tablets by mouth every 4 (four) hours as needed. (Patient not taking: Reported on 03/06/2016) 10 tablet 0   No current facility-administered medications on file prior to visit.     ROS ROS otherwise unremarkable unless listed above.   Physical Examination: BP 110/60 (BP Location: Right Arm, Patient Position: Sitting, Cuff Size: Normal)   Pulse 75   Temp 98.3 F (36.8 C) (Oral)   Resp 16   Ht 5' (1.524 m)   Wt 154 lb (69.9 kg)   LMP  (LMP Unknown) Comment: Depo x 1year  SpO2 99%   BMI 30.08 kg/m  Ideal Body Weight: Weight in (lb) to have BMI = 25: 127.7  Physical Exam  Constitutional: She is oriented to person, place, and time. She appears well-developed and well-nourished. No distress.  HENT:  Head: Normocephalic and atraumatic.  Right Ear: External ear normal.  Left Ear: External ear normal.  Eyes: Conjunctivae and EOM are normal. Pupils are equal, round, and reactive to light.  Cardiovascular: Normal rate.  Exam reveals no friction rub.   No murmur heard. Pulmonary/Chest: Effort normal. No respiratory distress.  Musculoskeletal:       Right hip: She exhibits normal range of motion, normal strength, no tenderness and no bony tenderness.       Right knee: She exhibits normal range of motion, no swelling and no bony tenderness. No medial joint line, no MCL and no LCL tenderness noted.  Lateral femur tenderness with deep palpation.  No ecchymosis. Knee without tenderness upon palpation   Neurological: She is alert and oriented to person, place, and time.  Skin: She is not diaphoretic.  Psychiatric: She has a normal mood and affect. Her behavior is normal.   Results for orders placed or performed in visit on 03/06/16  POCT urine pregnancy  Result Value Ref Range   Preg Test, Ur Negative  Negative     Assessment and Plan: Sharon Hooper is a 41 y.o. female who is here today for cc of depo shot, fall, and refill of pain medicine.  Advised that she will have to follow up with her pcp, which she states is as soon as possible to address her pain.  She states that her pcp is Dr. Katrinka BlazingSmith of this practice. Depo shot given today.  Advised of follow up. Pain management referral placed today.  Acute bilateral low back pain without sciatica - Plan: DG Lumbar Spine Complete  Acute pain of right knee - Plan: DG Knee Complete 4 Views Right  Other chronic pain - Plan: Ambulatory referral to Pain Clinic  Cerebral palsy, unspecified type (HCC) - Plan: Ambulatory referral to Pain Clinic  Sharon PlattStephanie English, PA-C [facility] 2/26/20182:31  PM   Depo-medrol given, without giving the depo-provera at this time for birth control.  Patient was contacted 2 weeks later and could not be located due to her phone being down.  Her address in [place] as well as snap shot is of the [facility].  This was contacted, and without knowledge of who was calling, they relays that she no longer has lived there since December.  After numerous call, was able to contact her "caretaker", where she was returned here on 2/29/18?Marland Kitchen..

## 2016-03-24 ENCOUNTER — Ambulatory Visit: Payer: Medicare Other | Admitting: Family Medicine

## 2016-03-24 ENCOUNTER — Telehealth: Payer: Self-pay | Admitting: Physician Assistant

## 2016-03-24 DIAGNOSIS — Z309 Encounter for contraceptive management, unspecified: Secondary | ICD-10-CM

## 2016-03-24 NOTE — Telephone Encounter (Signed)
Attempting to contact patient.  Leaving message with Audree CamelDenia who is on patient's contact information.  Advised to have patient contact us or return to visit with me.   If Denia, on ROI, calls back, have patient return Lillia Mountainkristina Adamec return to clinic urgently.  She should see Trena PlattStephanie Reda Citron or Nilda SimmerKristi Smith only.  I will be here tomorrow and Thursday until 1pm.   Please also update patient's contact information if they can. Address is outdated, and can no reach by phone.   Please also chart the information you receive.

## 2016-03-26 ENCOUNTER — Other Ambulatory Visit: Payer: Self-pay | Admitting: Physician Assistant

## 2016-03-26 ENCOUNTER — Ambulatory Visit: Payer: Medicare Other

## 2016-03-26 DIAGNOSIS — Z309 Encounter for contraceptive management, unspecified: Secondary | ICD-10-CM

## 2016-03-26 MED ORDER — BACLOFEN 10 MG PO TABS: 10.0000 mg | ORAL_TABLET | Freq: Three times a day (TID) | ORAL | 1 refills | Status: DC

## 2016-03-26 MED ORDER — MELOXICAM 15 MG PO TABS: 15.0000 mg | ORAL_TABLET | Freq: Every day | ORAL | 1 refills | Status: DC

## 2016-03-26 MED ORDER — MEDROXYPROGESTERONE ACETATE 150 MG/ML IM SUSY
150.0000 mg | PREFILLED_SYRINGE | Freq: Once | INTRAMUSCULAR | Status: AC
  Administered 2016-03-26: 150 mg via INTRAMUSCULAR

## 2016-04-06 NOTE — Telephone Encounter (Signed)
Preferred management medical records handed to caroline califf, so they can place the referral.  She will return to pharmacy

## 2016-05-27 ENCOUNTER — Other Ambulatory Visit: Payer: Self-pay | Admitting: Family Medicine

## 2016-05-28 NOTE — Telephone Encounter (Signed)
02/2016 last ov 

## 2016-06-08 ENCOUNTER — Emergency Department (HOSPITAL_COMMUNITY)
Admission: EM | Admit: 2016-06-08 | Discharge: 2016-06-08 | Disposition: A | Discharge status: Home / Self Care | Payer: Medicare Other | Attending: Emergency Medicine | Admitting: Emergency Medicine

## 2016-06-08 ENCOUNTER — Encounter (HOSPITAL_COMMUNITY): Payer: Self-pay

## 2016-06-08 ENCOUNTER — Emergency Department (HOSPITAL_COMMUNITY): Payer: Medicare Other

## 2016-06-08 DIAGNOSIS — R0602 Shortness of breath: Secondary | ICD-10-CM | POA: Diagnosis not present

## 2016-06-08 DIAGNOSIS — L243 Irritant contact dermatitis due to cosmetics: Secondary | ICD-10-CM

## 2016-06-08 DIAGNOSIS — F17210 Nicotine dependence, cigarettes, uncomplicated: Secondary | ICD-10-CM | POA: Diagnosis not present

## 2016-06-08 DIAGNOSIS — R05 Cough: Secondary | ICD-10-CM | POA: Diagnosis not present

## 2016-06-08 DIAGNOSIS — L250 Unspecified contact dermatitis due to cosmetics: Secondary | ICD-10-CM | POA: Diagnosis not present

## 2016-06-08 DIAGNOSIS — J309 Allergic rhinitis, unspecified: Secondary | ICD-10-CM | POA: Diagnosis not present

## 2016-06-08 LAB — RAPID STREP SCREEN (MED CTR MEBANE ONLY): Streptococcus, Group A Screen (Direct): NEGATIVE

## 2016-06-08 MED ORDER — DEXAMETHASONE SODIUM PHOSPHATE 10 MG/ML IJ SOLN
10.0000 mg | Freq: Once | INTRAMUSCULAR | Status: AC
  Administered 2016-06-08: 10 mg via INTRAMUSCULAR
  Filled 2016-06-08: qty 1

## 2016-06-08 MED ORDER — BETAMETHASONE DIPROPIONATE 0.05 % EX OINT: TOPICAL_OINTMENT | Freq: Two times a day (BID) | CUTANEOUS | 0 refills | Status: DC

## 2016-06-08 MED ORDER — ALBUTEROL SULFATE (2.5 MG/3ML) 0.083% IN NEBU
2.5000 mg | INHALATION_SOLUTION | Freq: Once | RESPIRATORY_TRACT | Status: AC
  Administered 2016-06-08: 2.5 mg via RESPIRATORY_TRACT
  Filled 2016-06-08: qty 3

## 2016-06-08 NOTE — ED Triage Notes (Signed)
States swelling of eyes and sore throat with raspy voice and nasal drainage clear to white and non productive cough no fever noted.

## 2016-06-08 NOTE — Discharge Instructions (Signed)
Please read and follow all provided instructions.  Your diagnoses today include:  1. Allergic sinusitis     Tests performed today include: Vital signs. See below for your results today.   Medications prescribed:  Take as prescribed   Home care instructions:  Follow any educational materials contained in this packet.  Follow-up instructions: Please follow-up with your primary care provider for further evaluation of symptoms and treatment   Return instructions:  Please return to the Emergency Department if you do not get better, if you get worse, or new symptoms OR  - Fever (temperature greater than 101.27F)  - Bleeding that does not stop with holding pressure to the area    -Severe pain (please note that you may be more sore the day after your accident)  - Chest Pain  - Difficulty breathing  - Severe nausea or vomiting  - Inability to tolerate food and liquids  - Passing out  - Skin becoming red around your wounds  - Change in mental status (confusion or lethargy)  - New numbness or weakness    Please return if you have any other emergent concerns.  Additional Information:  Your vital signs today were: BP 107/67 (BP Location: Right Arm)    Pulse 83    Temp 98.6 F (37 C) (Oral)    Resp 20    Ht 5' (1.524 m)    Wt 70.3 kg    LMP  (LMP Unknown)    SpO2 100%    BMI 30.27 kg/m  If your blood pressure (BP) was elevated above 135/85 this visit, please have this repeated by your doctor within one month. ---------------

## 2016-06-08 NOTE — ED Provider Notes (Signed)
[facility]-EMERGENCY DEPT Provider Note   CSN: [id number] Arrival date & time: 06/08/16  0442     History   Chief Complaint Chief Complaint  Patient presents with  . Sore Throat    HPI Sharon Hooper is a 41 y.o. female.  HPI  41 y.o. female with a hx of cerebral Palsy, presents to the Emergency Department today complaining of sore throat and sinus pressure since Friday. Pt states that she has had a dry cough as well as rhinorrhea. No fevers. No CP/SOB/ABD pain. No N/V/D. Notes taking allegra with minimal relief. Rates pain 5/10 currently in throat. Described as sore feeling. No meds PTA. No other symptoms noted   Past Medical History:  Diagnosis Date  . Cerebral palsy (HCC)   . Seizures [facility](HCC)     Patient Active Problem List   Diagnosis Date Noted  . 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 Right rotator cuff tendonitis 03/15/2014  . Tobacco use disorder 12/05/2013  . Encounter for monitoring opioid maintenance therapy 12/05/2013  . Right shoulder pain 08/15/2013  . Depo-Provera contraceptive status 03/01/2013  . Abnormal involuntary movement 07/02/2012  . Spasm of muscle 07/02/2012  . Pain in soft tissues of limb 07/02/2012  . Pain in joints 07/02/2012  . Lateral epicondylitis of elbow 07/02/2012  . Infantile cerebral palsy (HCC) 07/02/2012  . 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 Headache 05/11/2011  . History of seasonal allergies 11/06/2010  . Right foot pain 11/06/2010  . Intermittent tremor 10/24/2010  . Low back pain 06/03/2010  . Acquired hallux valgus 02/20/2010  . Hallux valgus, acquired 02/20/2010    History reviewed. No pertinent surgical history.  OB History    No data available       Home Medications    Prior to Admission medications   Medication Sig Start Date End Date Taking? Authorizing Provider  baclofen (LIORESAL) 10 MG tablet Take 1 tablet (10 mg total) by mouth 3 (three) times  daily. 03/26/16   Trena PlattEnglish, Stephanie D, PA  baclofen (LIORESAL) 10 MG tablet TAKE ONE TABLET BY MOUTH THREE TIMES DAILY 05/28/16   Trena PlattEnglish, Stephanie D, PA  cephALEXin (KEFLEX) 500 MG capsule Take 1 capsule (500 mg total) by mouth 4 (four) times daily. Patient not taking: Reported on 03/06/2016 02/13/16   Arthor CaptainHarris, Abigail, PA-C  clindamycin (CLEOCIN) 300 MG capsule Take 1 capsule (300 mg total) by mouth 3 (three) times daily. X 7 days Patient not taking: Reported on 03/06/2016 02/16/16   Tomasita Crumbleni, Adeleke, MD  fluticasone [facility](FLONASE) 50 MCG/ACT nasal spray Place 2 sprays into both nostrils daily. Patient not taking: Reported on 02/13/2016 01/08/16   Doristine BosworthStallings, Zoe A, MD  HYDROcodone-acetaminophen (NORCO) 5-325 MG tablet Take 1-2 tablets by mouth every 4 (four) hours as needed. Patient not taking: Reported on 03/06/2016 02/13/16   Arthor CaptainHarris, Abigail, PA-C  meloxicam (MOBIC) 15 MG tablet Take 1 tablet (15 mg total) by mouth daily. 03/26/16   Trena PlattEnglish, Stephanie D, PA  meloxicam (MOBIC) 15 MG tablet TAKE ONE TABLET BY MOUTH ONCE DAILY 05/28/16   Trena PlattEnglish, Stephanie D, PA  propranolol (INDERAL) 80 MG tablet TAKE ONE TABLET BY MOUTH ONCE DAILY 05/28/16   Garnetta BuddyEnglish, Stephanie D, PA    Family History History reviewed. No pertinent family history.  Social History Social History  Substance Use Topics  . Smoking status: Current Every Day Smoker    Packs/day: 1.00    Years: 15.00    Types: Cigarettes  .  Smokeless tobacco: Never Used  . Alcohol use 0.6 - 1.2 oz/week    1 - 2 Glasses of wine per week     Allergies   Tramadol and Penicillins   Review of Systems Review of Systems ROS reviewed and all are negative for acute change except as noted in the HPI.  Physical Exam Updated Vital Signs BP 108/74 (BP Location: Right Arm)   Pulse 95   Temp 98.4 F (36.9 C) (Oral)   Resp 18   Ht 5' (1.524 m)   Wt 70.3 kg   SpO2 100%   BMI 30.27 kg/m   Physical Exam  Constitutional: She is oriented to person, place, and time.  She appears well-developed and well-nourished. No distress.  HENT:  Head: Normocephalic and atraumatic.  Right Ear: Tympanic membrane, external ear and ear canal normal.  Left Ear: Tympanic membrane, external ear and ear canal normal.  Nose: Right sinus exhibits maxillary sinus tenderness. Right sinus exhibits no frontal sinus tenderness. Left sinus exhibits maxillary sinus tenderness. Left sinus exhibits no frontal sinus tenderness.  Mouth/Throat: Uvula is midline, oropharynx is clear and moist and mucous membranes are normal. No trismus in the jaw. No oropharyngeal exudate, posterior oropharyngeal erythema or tonsillar abscesses.  Eyes: EOM are normal. Pupils are equal, round, and reactive to light.  Neck: Normal range of motion. Neck supple. No tracheal deviation present.  Cardiovascular: Normal rate, regular rhythm, S1 normal, S2 normal, normal heart sounds, intact distal pulses and normal pulses.   Pulmonary/Chest: Effort normal. No respiratory distress. She has no decreased breath sounds. She has wheezes in the right upper field and the left upper field. She has no rhonchi. She has no rales.  Abdominal: Normal appearance and bowel sounds are normal. There is no tenderness.  Musculoskeletal: Normal range of motion.  Neurological: She is alert and oriented to person, place, and time.  Skin: Skin is warm and dry.  Contact dermatitis noted BUE underarms. No erythema. No purulence. No signs of infection.  Psychiatric: She has a normal mood and affect. Her speech is normal and behavior is normal. Thought content normal.     ED Treatments / Results  Labs (all labs ordered are listed, but only abnormal results are displayed) Labs Reviewed - No data to display  EKG  EKG Interpretation None       Radiology No results found.  Procedures Procedures (including critical care time)  Medications Ordered in ED Medications - No data to display   Initial Impression / Assessment and Plan  / ED Course  I have reviewed the triage vital signs and the nursing notes.  Pertinent labs & imaging results that were available during my care of the patient were reviewed by me and considered in my medical decision making (see chart for details).  Final Clinical Impressions(s) / ED Diagnoses  {I have reviewed and evaluated the relevant laboratory values. {I have reviewed and evaluated the relevant imaging studies.  {I have reviewed the relevant previous healthcare records.  {I obtained HPI from historian.   ED Course:  Assessment: Pt is a 41 y.o. female presents with URI symptoms since Friday . On exam, pt in NAD. VSS. Afebrile. Lungs with bilateral upper wheeze, Heart RRR. Abdomen nontender/soft. Pt CXR negative for acute infiltrate. Strep negative. Patients symptoms are consistent with URI, likely viral etiology vs allergic. Sinuses TTP. Given Decadron. Discussed that antibiotics are not indicated for viral infections. Pt will be discharged with symptomatic treatment. Pt also with contact dermatitis from  soap usage on bilateral upper underarms. Resolves without using soaps. Verbalizes understanding and is agreeable with plan. Pt is hemodynamically stable & in NAD prior to dc.  Disposition/Plan:  DC Home Additional Verbal discharge instructions given and discussed with patient.  Pt Instructed to f/u with PCP in the next week for evaluation and treatment of symptoms. Return precautions given Pt acknowledges and agrees with plan  Supervising Physician Azalia Bilis, MD  Final diagnoses:  Allergic sinusitis  Irritant contact dermatitis due to cosmetics    New Prescriptions New Prescriptions   No medications on file     Wilber Bihari 06/08/16 Beckie Salts, MD 06/08/16 [phone]

## 2016-06-11 LAB — CULTURE, GROUP A STREP (THRC)

## 2016-06-26 ENCOUNTER — Encounter: Payer: Self-pay | Admitting: Physician Assistant

## 2016-06-26 ENCOUNTER — Ambulatory Visit (INDEPENDENT_AMBULATORY_CARE_PROVIDER_SITE_OTHER): Payer: Medicare Other | Admitting: Physician Assistant

## 2016-06-26 VITALS — BP 111/74 | HR 82 | Temp 98.6°F | Resp 17 | Ht 62.0 in | Wt 155.0 lb

## 2016-06-26 DIAGNOSIS — Z3042 Encounter for surveillance of injectable contraceptive: Secondary | ICD-10-CM

## 2016-06-26 MED ORDER — MEDROXYPROGESTERONE ACETATE 150 MG/ML IM SUSY: 150.0000 mg | PREFILLED_SYRINGE | Freq: Once | INTRAMUSCULAR | Status: DC

## 2016-06-26 NOTE — Progress Notes (Deleted)
OTHEL DICOSTANZO  MRN: [id number] DOB: [birth year]  PCP: Ethelda Chick, MD  Chief Complaint  Patient presents with  . Immunizations    depo shot     Subjective:  Pt presents to clinic for depo-provera injection - last injection 3/1 -   Review of Systems  Patient Active Problem List   Diagnosis Date Noted  . 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 Right rotator cuff tendonitis 03/15/2014  . Tobacco use disorder 12/05/2013  . Encounter for monitoring opioid maintenance therapy 12/05/2013  . Right shoulder pain 08/15/2013  . Depo-Provera contraceptive status 03/01/2013  . Abnormal involuntary movement 07/02/2012  . Spasm of muscle 07/02/2012  . Pain in soft tissues of limb 07/02/2012  . Pain in joints 07/02/2012  . Lateral epicondylitis of elbow 07/02/2012  . Infantile cerebral palsy (HCC) 07/02/2012  . 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 Headache 05/11/2011  . History of seasonal allergies 11/06/2010  . Right foot pain 11/06/2010  . Intermittent tremor 10/24/2010  . Low back pain 06/03/2010  . Acquired hallux valgus 02/20/2010  . Hallux valgus, acquired 02/20/2010    Current Outpatient Prescriptions on File Prior to Visit  Medication Sig Dispense Refill  . baclofen (LIORESAL) 10 MG tablet Take 1 tablet (10 mg total) by mouth 3 (three) times daily. 90 each 1  . baclofen (LIORESAL) 10 MG tablet TAKE ONE TABLET BY MOUTH THREE TIMES DAILY 90 tablet 0  . betamethasone dipropionate (DIPROLENE) 0.05 % ointment Apply topically 2 (two) times daily. 30 g 0  . fluticasone (FLONASE) 50 MCG/ACT nasal spray Place 2 sprays into both nostrils daily. 16 g 6  . HYDROcodone-acetaminophen (NORCO) 5-325 MG tablet Take 1-2 tablets by mouth every 4 (four) hours as needed. 10 tablet 0  . meloxicam (MOBIC) 15 MG tablet Take 1 tablet (15 mg total) by mouth daily. 30 tablet 1  . meloxicam (MOBIC) 15 MG tablet TAKE ONE TABLET BY  MOUTH ONCE DAILY 30 tablet 1  . propranolol (INDERAL) 80 MG tablet TAKE ONE TABLET BY MOUTH ONCE DAILY 90 tablet 0  . cephALEXin (KEFLEX) 500 MG capsule Take 1 capsule (500 mg total) by mouth 4 (four) times daily. (Patient not taking: Reported on 03/06/2016) 20 capsule 0  . clindamycin (CLEOCIN) 300 MG capsule Take 1 capsule (300 mg total) by mouth 3 (three) times daily. X 7 days (Patient not taking: Reported on 03/06/2016) 21 capsule 0   No current facility-administered medications on file prior to visit.     Allergies  Allergen Reactions  . Tramadol     Other reaction(s): Other Other reaction(s): Other Per patient she states it counteracts with her meds//increases tremors  . Flonase [Fluticasone Propionate] Other (See Comments)    Nose bleed    . Penicillins Nausea And Vomiting    Has patient had a PCN reaction causing immediate rash, facial/tongue/throat swelling, SOB or lightheadedness with hypotension: No Has patient had a PCN reaction causing severe rash involving mucus membranes or skin necrosis: No Has patient had a PCN reaction that required hospitalization No Has patient had a PCN reaction occurring within the last 10 years: No If all of the above answers are "NO", then may proceed with Cephalosporin use.     Pt patients past, family and social history were reviewed and updated.   Objective:  BP 111/74   Pulse 82   Temp 98.6 F (37 C) (Oral)   Resp 17   Ht  5\' 2"  (1.575 m)   Wt 155 lb (70.3 kg)   LMP  (LMP Unknown)   SpO2 98%   BMI 28.35 kg/m   Physical Exam  Assessment and Plan :  No diagnosis found.  Benny LennertSarah Weber PA-C  [facility] 06/26/2016 12:10 PM

## 2016-06-26 NOTE — Progress Notes (Signed)
Pt presents to clinic for depo-provera injection - last injection 3/1 - she is 1 day outside of her window - she would like to continue the medication.  Injection was ordered and was being drawn up and the patient left saying that she did not want it anymore.

## 2016-06-26 NOTE — Patient Instructions (Signed)
     IF you received an x-ray today, you will receive an invoice from [facility]. Please contact [facility] at [phone] with questions or concerns regarding your invoice.   IF you received labwork today, you will receive an invoice from [facility]. Please contact [facility] at [phone] with questions or concerns regarding your invoice.   Our billing staff will not be able to assist you with questions regarding bills from these companies.  You will be contacted with the lab results as soon as they are available. The fastest way to get your results is to activate your My Chart account. Instructions are located on the last page of this paperwork. If you have not heard from us regarding the results in 2 weeks, please contact this office.     

## 2016-07-18 ENCOUNTER — Telehealth: Payer: Self-pay | Admitting: Physician Assistant

## 2016-07-18 NOTE — Telephone Encounter (Signed)
Received a letter from the [facility] attempting to get a hold of Sharon Hooper for futher work up of breast imaging.  Please fax them over the caretaker's contact information on the ROI, with Sharon Hooper's name and birth date, and that they had asked for further ways to contact the patient.   They can attempt to reach her through this.  The number to fax [phone].

## 2016-07-21 NOTE — Telephone Encounter (Signed)
Faxed [facility] to [facility]. Confirmation fax received.

## 2016-07-24 DIAGNOSIS — R87612 Low grade squamous intraepithelial lesion on cytologic smear of cervix (LGSIL): Secondary | ICD-10-CM | POA: Diagnosis not present

## 2016-07-24 DIAGNOSIS — N760 Acute vaginitis: Secondary | ICD-10-CM | POA: Diagnosis not present

## 2016-08-04 DIAGNOSIS — R87612 Low grade squamous intraepithelial lesion on cytologic smear of cervix (LGSIL): Secondary | ICD-10-CM | POA: Diagnosis not present

## 2016-08-15 ENCOUNTER — Emergency Department (HOSPITAL_COMMUNITY)
Admission: EM | Admit: 2016-08-15 | Discharge: 2016-08-15 | Disposition: A | Discharge status: Home / Self Care | Payer: Medicare Other | Attending: Emergency Medicine | Admitting: Emergency Medicine

## 2016-08-15 ENCOUNTER — Encounter (HOSPITAL_COMMUNITY): Payer: Self-pay

## 2016-08-15 DIAGNOSIS — Z760 Encounter for issue of repeat prescription: Secondary | ICD-10-CM

## 2016-08-15 DIAGNOSIS — G809 Cerebral palsy, unspecified: Secondary | ICD-10-CM | POA: Insufficient documentation

## 2016-08-15 DIAGNOSIS — F17210 Nicotine dependence, cigarettes, uncomplicated: Secondary | ICD-10-CM | POA: Diagnosis not present

## 2016-08-15 DIAGNOSIS — B3731 Acute candidiasis of vulva and vagina: Secondary | ICD-10-CM

## 2016-08-15 DIAGNOSIS — Z79899 Other long term (current) drug therapy: Secondary | ICD-10-CM | POA: Insufficient documentation

## 2016-08-15 DIAGNOSIS — B373 Candidiasis of vulva and vagina: Secondary | ICD-10-CM | POA: Insufficient documentation

## 2016-08-15 DIAGNOSIS — Z8742 Personal history of other diseases of the female genital tract: Secondary | ICD-10-CM | POA: Diagnosis not present

## 2016-08-15 DIAGNOSIS — Z880 Allergy status to penicillin: Secondary | ICD-10-CM | POA: Diagnosis not present

## 2016-08-15 DIAGNOSIS — N764 Abscess of vulva: Secondary | ICD-10-CM | POA: Diagnosis present

## 2016-08-15 LAB — CBC WITH DIFFERENTIAL/PLATELET
Basophils Absolute: 0 K/uL (ref 0.0–0.1)
Basophils Relative: 0 %
Eosinophils Absolute: 0.2 K/uL (ref 0.0–0.7)
Eosinophils Relative: 2 %
HCT: 36.6 % (ref 36.0–46.0)
Hemoglobin: 11.8 g/dL — ABNORMAL LOW (ref 12.0–15.0)
Lymphocytes Relative: 28 %
Lymphs Abs: 1.9 K/uL (ref 0.7–4.0)
MCH: 29.4 pg (ref 26.0–34.0)
MCHC: 32.2 g/dL (ref 30.0–36.0)
MCV: 91.3 fL (ref 78.0–100.0)
Monocytes Absolute: 0.6 K/uL (ref 0.1–1.0)
Monocytes Relative: 10 %
Neutro Abs: 4 K/uL (ref 1.7–7.7)
Neutrophils Relative %: 60 %
Platelets: 155 K/uL (ref 150–400)
RBC: 4.01 MIL/uL (ref 3.87–5.11)
RDW: 12.8 % (ref 11.5–15.5)
WBC: 6.7 K/uL (ref 4.0–10.5)

## 2016-08-15 LAB — BASIC METABOLIC PANEL
ANION GAP: 12 (ref 5–15)
BUN: 11 mg/dL (ref 6–20)
CALCIUM: 8.5 mg/dL — AB (ref 8.9–10.3)
CO2: 19 mmol/L — ABNORMAL LOW (ref 22–32)
Chloride: 104 mmol/L (ref 101–111)
Creatinine, Ser: 0.63 mg/dL (ref 0.44–1.00)
Glucose, Bld: 78 mg/dL (ref 65–99)
Potassium: 3.6 mmol/L (ref 3.5–5.1)
Sodium: 135 mmol/L (ref 135–145)

## 2016-08-15 LAB — WET PREP, GENITAL
Clue Cells Wet Prep HPF POC: NONE SEEN
SPERM: NONE SEEN
Trich, Wet Prep: NONE SEEN
Yeast Wet Prep HPF POC: NONE SEEN

## 2016-08-15 LAB — I-STAT BETA HCG BLOOD, ED (MC, WL, AP ONLY): I-stat hCG, quantitative: <5 m[IU]/mL

## 2016-08-15 MED ORDER — BACLOFEN 10 MG PO TABS: 10.0000 mg | ORAL_TABLET | Freq: Three times a day (TID) | ORAL | 0 refills | Status: AC

## 2016-08-15 MED ORDER — MELOXICAM 15 MG PO TABS
15.0000 mg | ORAL_TABLET | ORAL | Status: AC
  Administered 2016-08-15: 15 mg via ORAL
  Filled 2016-08-15: qty 1

## 2016-08-15 MED ORDER — FLUCONAZOLE 100 MG PO TABS
150.0000 mg | ORAL_TABLET | Freq: Once | ORAL | Status: AC
  Administered 2016-08-15: 150 mg via ORAL
  Filled 2016-08-15: qty 2

## 2016-08-15 MED ORDER — MELOXICAM 15 MG PO TABS: 15.0000 mg | ORAL_TABLET | Freq: Every day | ORAL | 0 refills | Status: AC

## 2016-08-15 MED ORDER — PROPRANOLOL HCL 40 MG PO TABS
40.0000 mg | ORAL_TABLET | ORAL | Status: AC
  Administered 2016-08-15: 40 mg via ORAL
  Filled 2016-08-15: qty 1

## 2016-08-15 MED ORDER — BACLOFEN 10 MG PO TABS
10.0000 mg | ORAL_TABLET | ORAL | Status: AC
  Administered 2016-08-15: 10 mg via ORAL
  Filled 2016-08-15: qty 1

## 2016-08-15 MED ORDER — PROPRANOLOL HCL 80 MG PO TABS: 80.0000 mg | ORAL_TABLET | Freq: Every day | ORAL | 0 refills | Status: AC

## 2016-08-15 MED ORDER — MUPIROCIN CALCIUM 2 % EX CREA: 1.0000 "application " | TOPICAL_CREAM | Freq: Two times a day (BID) | CUTANEOUS | 0 refills | Status: DC

## 2016-08-15 MED ORDER — LIDOCAINE HCL 2 % EX GEL
1.0000 "application " | Freq: Once | CUTANEOUS | Status: AC
  Administered 2016-08-15: 1 via TOPICAL
  Filled 2016-08-15: qty 20

## 2016-08-15 NOTE — ED Provider Notes (Signed)
[facility]-EMERGENCY DEPT Provider Note   CSN: [id number] Arrival date & time: 08/15/16  1054     History   Chief Complaint Chief Complaint  Patient presents with  . Abscess on vagina    HPI Sharon Hooper is a 41 y.o. female.  HPI   Pt with hx cerebral palsy p/w vaginal infection.  States she had a hair bump that she shaved off 3-4 days ago and the area has become yellow.  She is using hand sanitizer and vaseline or neosporin on the area without improvement.  Associated chills.  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  Has not been sexually active in 1 month.  Also notes she has been in the [facility] with her father for the past two days and has not had her home medications.    Past Medical History:  Diagnosis Date  . Cerebral palsy (HCC)   . Seizures [facility])     Patient Active Problem List   Diagnosis Date Noted  . 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 Right rotator cuff tendonitis 03/15/2014  . Tobacco use disorder 12/05/2013  . Encounter for monitoring opioid maintenance therapy 12/05/2013  . Right shoulder pain 08/15/2013  . Depo-Provera contraceptive status 03/01/2013  . Abnormal involuntary movement 07/02/2012  . Spasm of muscle 07/02/2012  . Pain in soft tissues of limb 07/02/2012  . Pain in joints 07/02/2012  . Lateral epicondylitis of elbow 07/02/2012  . Infantile cerebral palsy (HCC) 07/02/2012  . 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 Headache 05/11/2011  . History of seasonal allergies 11/06/2010  . Right foot pain 11/06/2010  . Intermittent tremor 10/24/2010  . Low back pain 06/03/2010  . Acquired hallux valgus 02/20/2010  . Hallux valgus, acquired 02/20/2010    History reviewed. No pertinent surgical history.  OB History    No data available       Home Medications    Prior to Admission medications   Medication Sig Start Date End Date Taking? Authorizing Provider  Multiple  Vitamins-Iron (MULTIVITAMINS WITH IRON) TABS tablet Take 1 tablet by mouth daily.   Yes [provider]  baclofen (LIORESAL) 10 MG tablet Take 1 tablet (10 mg total) by mouth 3 (three) times daily. 08/15/16   Trixie Dredge, PA-C  betamethasone dipropionate (DIPROLENE) 0.05 % ointment Apply topically 2 (two) times daily. Patient not taking: Reported on 08/15/2016 06/08/16   Audry Pili, PA-C  fluticasone [facility]) 50 MCG/ACT nasal spray Place 2 sprays into both nostrils daily. Patient not taking: Reported on 08/15/2016 01/08/16   Doristine Bosworth, MD  HYDROcodone-acetaminophen (NORCO) 5-325 MG tablet Take 1-2 tablets by mouth every 4 (four) hours as needed. Patient not taking: Reported on 08/15/2016 02/13/16   Arthor Captain, PA-C  meloxicam (MOBIC) 15 MG tablet Take 1 tablet (15 mg total) by mouth daily. 08/15/16   Trixie Dredge, PA-C  mupirocin cream (BACTROBAN) 2 % Apply 1 application topically 2 (two) times daily. 08/15/16   Trixie Dredge, PA-C  propranolol (INDERAL) 80 MG tablet Take 1 tablet (80 mg total) by mouth daily. 08/15/16   Trixie Dredge, PA-C    Family History No family history on file.  Social History Social History  Substance Use Topics  . Smoking status: Current Every Day Smoker    Packs/day: 1.00    Years: 22.00    Types: Cigarettes  . Smokeless tobacco: Never Used  . Alcohol use 0.6 - 1.2 oz/week  1 - 2 Glasses of wine per week     Allergies   Tramadol; Flonase [fluticasone propionate]; and Penicillins   Review of Systems Review of Systems  All other systems reviewed and are negative.    Physical Exam Updated Vital Signs BP (!) 94/58 (BP Location: Right Arm)   Pulse 71   Temp 98.4 F (36.9 C) (Oral)   Resp 16   SpO2 99%   Physical Exam  Constitutional: She appears well-developed and well-nourished. No distress.  HENT:  Head: Normocephalic and atraumatic.  Neck: Neck supple.  Pulmonary/Chest: Effort normal.  Abdominal: Soft. She exhibits no  distension and no mass. There is no tenderness. There is no rebound and no guarding.  Genitourinary: There is rash and lesion on the right labia. There is rash and lesion on the left labia. Cervix exhibits no motion tenderness. Right adnexum displays no mass, no tenderness and no fullness. Left adnexum displays no mass, no tenderness and no fullness. No erythema, tenderness or bleeding in the vagina. No foreign body in the vagina. Vaginal discharge found.  Genitourinary Comments: Thick white vaginal discharge in vagina and around vulva.  Erythematous lesions around entire vulvar and perineal area. Genital warts also present.  Two shallow yellow ulcerations of bilateral labia, tender with swabbing.    Neurological: She is alert.  Skin: She is not diaphoretic.  Nursing note and vitals reviewed.    ED Treatments / Results  Labs (all labs ordered are listed, but only abnormal results are displayed) Labs Reviewed  WET PREP, GENITAL - Abnormal; Notable for the following:       Result Value   WBC, Wet Prep HPF POC MODERATE (*)    All other components within normal limits  BASIC METABOLIC PANEL - Abnormal; Notable for the following:    CO2 19 (*)    Calcium 8.5 (*)    All other components within normal limits  CBC WITH DIFFERENTIAL/PLATELET - Abnormal; Notable for the following:    Hemoglobin 11.8 (*)    All other components within normal limits  HSV CULTURE AND TYPING  RPR  HIV ANTIBODY (ROUTINE TESTING)  I-STAT BETA HCG BLOOD, ED ([facility], [facility], [facility] ONLY)  GC/CHLAMYDIA PROBE AMP ([facility]) NOT AT [facility]    EKG  EKG Interpretation None       Radiology No results found.  Procedures Procedures (including critical care time)  Medications Ordered in ED Medications  baclofen (LIORESAL) tablet 10 mg (10 mg Oral Given 08/15/16 1334)  meloxicam (MOBIC) tablet 15 mg (15 mg Oral Given 08/15/16 1334)  propranolol (INDERAL) tablet 40 mg (40 mg Oral Given 08/15/16 1334)  lidocaine (XYLOCAINE) 2 %  jelly 1 application (1 application Topical Given 08/15/16 1406)  fluconazole (DIFLUCAN) tablet 150 mg (150 mg Oral Given 08/15/16 1406)     Initial Impression / Assessment and Plan / ED Course  I have reviewed the triage vital signs and the nursing notes.  Pertinent labs & imaging results that were available during my care of the patient were reviewed by me and considered in my medical decision making (see chart for details).  Clinical Course as of Aug 16 1923  Sat Aug 15, 2016  1308 Pt requests her home medications - has been without them for two days.  Muscle relaxant, mobic, propranolol.    [EW]    Clinical Course User Index [EW] Chad, Patte Winkel, [place]    Afebrile, nontoxic patient with painful vaginal lesions.  Pt states these started off as "hair bumps"  and pus came out of them.  They are currently shallow ulcers - herpes culture sent.  No surrounding cellulitis, no fluctuance or induration suggestion underlying abscess.  Pt given bactroban for topical use.  Pt also with abnormal vaginal discharge, has not been sexually active x 1 month, no cervical motion tenderness.  Did have colposcopy 2 weeks ago - no apparent infection involving the cervix.  Clinically may be yeast, treated empirically.   Labs otherwise unremarkable.  Pt given dose of her home medications and she is currently staying in hospital with father and unable to access her medications - also gave prescriptions for 1 week supply.   Pt given topical lidocaine jelly for vulvar lesions to help with the pain.  D/C home.  Discussed result, findings, treatment, and follow up  with patient.  Pt given return precautions.  Pt verbalizes understanding and agrees with plan.       Final Clinical Impressions(s) / ED Diagnoses   Final diagnoses:  Vaginal yeast infection  History of vaginal sores  Medication refill    New Prescriptions Discharge Medication List as of 08/15/2016  3:14 PM    START taking these medications   Details    mupirocin cream (BACTROBAN) 2 % Apply 1 application topically 2 (two) times daily., Starting Sat 08/15/2016, Print         Trixie DredgeWest, Amitai Delaughter, [place]-C 08/15/16 1925    Arby BarrettePfeiffer, Marcy, MD 08/16/16 [phone]

## 2016-08-15 NOTE — Discharge Instructions (Signed)
Read the information below.  Use the prescribed medication as directed.  Please discuss all new medications with your pharmacist.  You may return to the Emergency Department at any time for worsening condition or any new symptoms that concern you.     Use the ointment over the sores to help them heal.  You may also take the numbing medication and apply up to 4 times daily as needed for pain.  If you develop redness, swelling, pus draining from the wound, or fevers greater than 100.4, return to the ER immediately for a recheck.

## 2016-08-15 NOTE — ED Triage Notes (Signed)
Patient complains of vaginal pain x 1 week. States that she has abscess on labia and pain with any ROM. Denies fever, no drainage.

## 2016-08-15 NOTE — ED Notes (Signed)
Pt requesting to stay in room until sister can come get her and show her how to get upstairs. Pt informed she would not be able to wait in the room. RN explained that we have other pts who need to be seen. RN explained that pt may wait in the lobby for her sister or RN can give her directions on how to get upstairs. Pt waiting in lobby.

## 2016-08-17 LAB — GC/CHLAMYDIA PROBE AMP (~~LOC~~) NOT AT ARMC
CHLAMYDIA, DNA PROBE: NEGATIVE
NEISSERIA GONORRHEA: NEGATIVE

## 2016-08-19 LAB — HSV CULTURE AND TYPING

## 2016-08-19 LAB — RPR: RPR: NONREACTIVE

## 2016-08-19 LAB — HIV ANTIBODY (ROUTINE TESTING W REFLEX): HIV SCREEN 4TH GENERATION: NONREACTIVE

## 2016-10-20 ENCOUNTER — Emergency Department (HOSPITAL_COMMUNITY): Payer: Medicare Other

## 2016-10-20 ENCOUNTER — Emergency Department (HOSPITAL_COMMUNITY)
Admission: EM | Admit: 2016-10-20 | Discharge: 2016-10-20 | Disposition: A | Discharge status: Home / Self Care | Payer: Medicare Other | Attending: Emergency Medicine | Admitting: Emergency Medicine

## 2016-10-20 ENCOUNTER — Encounter (HOSPITAL_COMMUNITY): Payer: Self-pay | Admitting: *Deleted

## 2016-10-20 DIAGNOSIS — G809 Cerebral palsy, unspecified: Secondary | ICD-10-CM | POA: Diagnosis not present

## 2016-10-20 DIAGNOSIS — J189 Pneumonia, unspecified organism: Secondary | ICD-10-CM

## 2016-10-20 DIAGNOSIS — Z79899 Other long term (current) drug therapy: Secondary | ICD-10-CM | POA: Diagnosis not present

## 2016-10-20 DIAGNOSIS — M546 Pain in thoracic spine: Secondary | ICD-10-CM | POA: Insufficient documentation

## 2016-10-20 DIAGNOSIS — M545 Low back pain: Secondary | ICD-10-CM | POA: Diagnosis present

## 2016-10-20 DIAGNOSIS — R0789 Other chest pain: Secondary | ICD-10-CM | POA: Diagnosis not present

## 2016-10-20 DIAGNOSIS — J181 Lobar pneumonia, unspecified organism: Secondary | ICD-10-CM

## 2016-10-20 DIAGNOSIS — F17210 Nicotine dependence, cigarettes, uncomplicated: Secondary | ICD-10-CM | POA: Diagnosis not present

## 2016-10-20 MED ORDER — AZITHROMYCIN 500 MG PO TABS: 500.0000 mg | ORAL_TABLET | Freq: Every day | ORAL | 0 refills | Status: DC

## 2016-10-20 MED ORDER — IBUPROFEN 800 MG PO TABS
800.0000 mg | ORAL_TABLET | Freq: Once | ORAL | Status: AC
  Administered 2016-10-20: 800 mg via ORAL
  Filled 2016-10-20: qty 1

## 2016-10-20 MED ORDER — AZITHROMYCIN 500 MG PO TABS: 500.0000 mg | ORAL_TABLET | Freq: Every day | ORAL | 0 refills | Status: AC

## 2016-10-20 MED ORDER — DOXYCYCLINE HYCLATE 100 MG PO CAPS: 100.0000 mg | ORAL_CAPSULE | Freq: Two times a day (BID) | ORAL | 0 refills | Status: DC

## 2016-10-20 NOTE — ED Provider Notes (Signed)
[facility]-EMERGENCY DEPT Provider Note   CSN: [id number] Arrival date & time: 10/20/16  0007     History   Chief Complaint Chief Complaint  Patient presents with  . Back Pain  . Chest Pain    HPI Sharon Hooper is a 41 y.o. female with a history of chronic low back pain who presents to the emergency department with non-productive cough with associated chest tightness that radiates to her mid- and low back as sharp shooting pain x2 days. No known trauma or injury. No sick contacts. No treatment prior to arrival.  She denies congestion, sore throat, rash, dysuria, frequency, flank pain, vaginal discharge or pain, abdominal pain, N/V/D, HA, fever, or chills.   The history is provided by the patient. No language interpreter was used.    Past Medical History:  Diagnosis Date  . Cerebral palsy (HCC)   . Seizures [facility])     Patient Active Problem List   Diagnosis Date Noted  . 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 Right rotator cuff tendonitis 03/15/2014  . Tobacco use disorder 12/05/2013  . Encounter for monitoring opioid maintenance therapy 12/05/2013  . Right shoulder pain 08/15/2013  . Depo-Provera contraceptive status 03/01/2013  . Abnormal involuntary movement 07/02/2012  . Spasm of muscle 07/02/2012  . Pain in soft tissues of limb 07/02/2012  . Pain in joints 07/02/2012  . Lateral epicondylitis of elbow 07/02/2012  . Infantile cerebral palsy (HCC) 07/02/2012  . 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 Headache 05/11/2011  . History of seasonal allergies 11/06/2010  . Right foot pain 11/06/2010  . Intermittent tremor 10/24/2010  . Low back pain 06/03/2010  . Acquired hallux valgus 02/20/2010  . Hallux valgus, acquired 02/20/2010    History reviewed. No pertinent surgical history.  OB History    No data available       Home Medications    Prior to Admission medications   Medication Sig Start Date  End Date Taking? Authorizing Provider  acetaminophen (TYLENOL) 500 MG tablet Take 500 mg by mouth every 6 (six) hours as needed.   Yes [provider]  baclofen (LIORESAL) 10 MG tablet Take 1 tablet (10 mg total) by mouth 3 (three) times daily. 08/15/16  Yes West, Emily, PA-C  meloxicam (MOBIC) 15 MG tablet Take 1 tablet (15 mg total) by mouth daily. 08/15/16  Yes West, Emily, PA-C  Multiple Vitamins-Iron (MULTIVITAMINS WITH IRON) TABS tablet Take 1 tablet by mouth daily.   Yes [provider]  propranolol (INDERAL) 80 MG tablet Take 1 tablet (80 mg total) by mouth daily. 08/15/16  Yes West, Emily, PA-C  azithromycin (ZITHROMAX) 500 MG tablet Take 1 tablet (500 mg total) by mouth daily. 10/20/16 10/25/16  Michaell Grider, Pedro Earls A, PA-C    Family History No family history on file.  Social History Social History  Substance Use Topics  . Smoking status: Current Every Day Smoker    Packs/day: 1.00    Years: 22.00    Types: Cigarettes  . Smokeless tobacco: Never Used  . Alcohol use 0.6 - 1.2 oz/week    1 - 2 Glasses of wine per week     Allergies   Tramadol; Flonase [fluticasone propionate]; and Penicillins   Review of Systems Review of Systems  Constitutional: Negative for chills and fever.  HENT: Negative for congestion and sore throat.   Respiratory: Positive for cough. Negative for shortness of breath.   Cardiovascular: Positive for chest pain and  leg swelling.  Gastrointestinal: Negative for abdominal pain.  Genitourinary: Negative for dysuria, flank pain, frequency, vaginal discharge and vaginal pain.  Musculoskeletal: Positive for back pain.     Physical Exam Updated Vital Signs BP 114/79   Pulse 79   Temp (!) 97.5 F (36.4 C) (Oral)   Resp 14   SpO2 100%   Physical Exam  Constitutional: She is oriented to person, place, and time. No distress.  Sleeping comfortable on arrival.   HENT:  Head: Normocephalic.  Eyes: Conjunctivae are normal. No scleral  icterus.  Neck: Neck supple.  Cardiovascular: Normal rate, regular rhythm, normal heart sounds and intact distal pulses.  Exam reveals no gallop and no friction rub.   No murmur heard. Pulmonary/Chest: Effort normal and breath sounds normal. No respiratory distress. She has no wheezes. She has no rales.  Lungs are clear to auscultation bilaterally.  Abdominal: Soft. Bowel sounds are normal. She exhibits no distension. There is no tenderness. There is no rebound and no guarding.  No CVA tenderness bilaterally.  Musculoskeletal: She exhibits tenderness. She exhibits no edema or deformity.  Diffuse, mild TTP over the right paraspinal muscles of the thoracic and lumbar spine. No tenderness to palpation of the spinous processes of the cervical, thoracic, lumbar spine. No edema to the bilateral lower extremities.   Neurological: She is alert and oriented to person, place, and time.  Skin: Skin is warm. Capillary refill takes less than 2 seconds. No rash noted. She is not diaphoretic.  Psychiatric: Her behavior is normal.  Nursing note and vitals reviewed.    ED Treatments / Results  Labs (all labs ordered are listed, but only abnormal results are displayed) Labs Reviewed - No data to display  EKG  EKG Interpretation None       Radiology Dg Chest 2 View  Result Date: 10/20/2016 CLINICAL DATA:  Mid chest and mid back pain and cough for the past 2 days. Patient is an occasional smoker. EXAM: CHEST  2 VIEW COMPARISON:  PA and lateral chest x-ray of Jun 08, 2016 FINDINGS: The lungs are well-expanded. There is an infiltrate in the right lower lobe posteriorly. There is subtle interstitial density in the left upper lobe which is new. The heart and pulmonary vascularity are normal. The bony thorax exhibits no acute abnormality. IMPRESSION: Right lower lobe pneumonia. Early pneumonia or subsegmental atelectasis in the left upper lobe. Follow-up radiographs following anticipated antibiotic therapy  are recommended to assure clearing. Electronically Signed   By: David  [place] M.D.   On: 10/20/2016 07:46    Procedures Procedures (including critical care time)  Medications Ordered in ED Medications  ibuprofen (ADVIL,MOTRIN) tablet 800 mg (800 mg Oral Given 10/20/16 0953)     Initial Impression / Assessment and Plan / ED Course  I have reviewed the triage vital signs and the nursing notes.  Pertinent labs & imaging results that were available during my care of the patient were reviewed by me and considered in my medical decision making (see chart for details).     Patient has been diagnosed with CAP via chest xray. Pt is not ill appearing, immunocompromised, and does not have multiple co-morbidities, therefore I feel like the they can be treated as an OP with abx therapy. SaO2 100%. Pt has been advised to return to the ED if symptoms worsen or they do not improve. Pt verbalizes understanding and is agreeable with plan.   Final Clinical Impressions(s) / ED Diagnoses   Final diagnoses:  Pneumonia  of right lower lobe due to infectious organism [facility])    New Prescriptions Discharge Medication List as of 10/20/2016  9:56 AM       Barkley Boards, PA-C 10/20/16 1025    Mesner, Barbara Cower, MD 10/20/16 1109

## 2016-10-20 NOTE — ED Triage Notes (Signed)
Pt was just here as a visitor sitting with a patient.  She reports she has back pain and chest pain x 2 days.  Asked pt why she did not check in when her family was being seen.  She states she wanted to make sure that she was okay before she checked in.  Pt reports back pain could be from laying on a hard bed.

## 2016-10-20 NOTE — Discharge Instructions (Signed)
Please take azithromycin once daily for the next 5 days. Please continue to drink plenty of fluids to stay hydrated.  You can take Tylenol 650 mg every 8 hours as needed for fever or pain.  Please make sure to cover her mouth and nose if he sneezes or coughs. Please make sure to wash your hands after coughing to avoid spread of infection.   If you develop worsening shortness of breath, chest pain, or fever that does not improve with Tylenol, please return to the Emergency Department for re-evaluation.

## 2016-10-23 ENCOUNTER — Other Ambulatory Visit: Payer: Self-pay | Admitting: Physician Assistant

## 2017-05-05 ENCOUNTER — Encounter: Payer: Self-pay | Admitting: Physician Assistant

## 2017-06-17 ENCOUNTER — Encounter: Payer: Self-pay | Admitting: Family Medicine

## 2017-08-26 IMAGING — CR DG FOOT COMPLETE 3+V*R*
3 series · 3 of 3 positions shown · non-contrast
Comparison: None.

CLINICAL DATA: Anterior foot pain for 48 hours. Lateral foot
swelling. Prior fracture of the great toe.

EXAM:
RIGHT FOOT COMPLETE - 3+ VIEW

[x foot ap right]
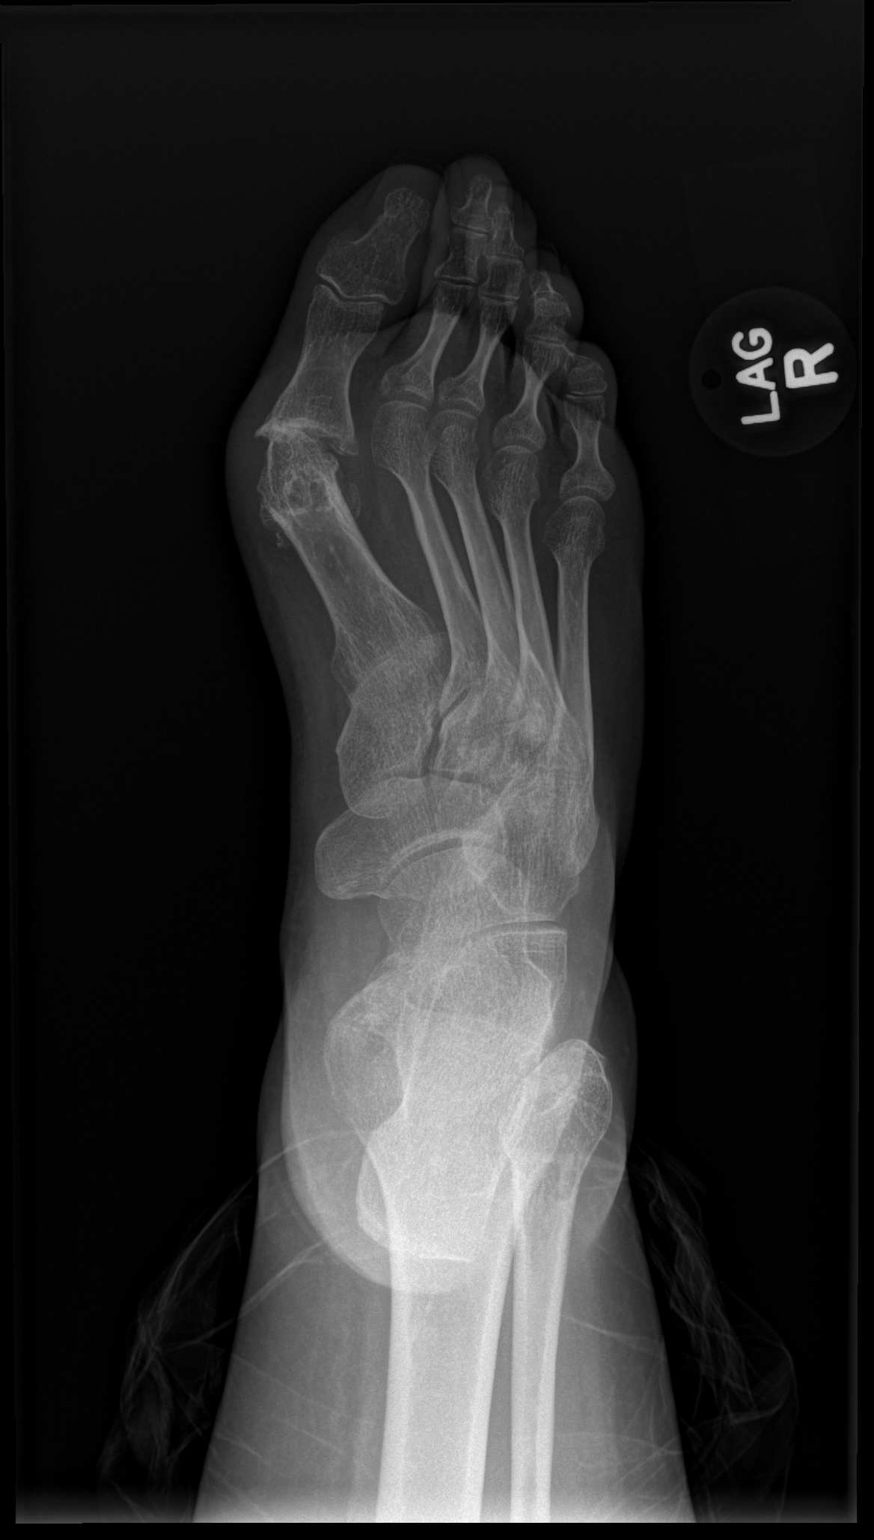

[x foot obl right]
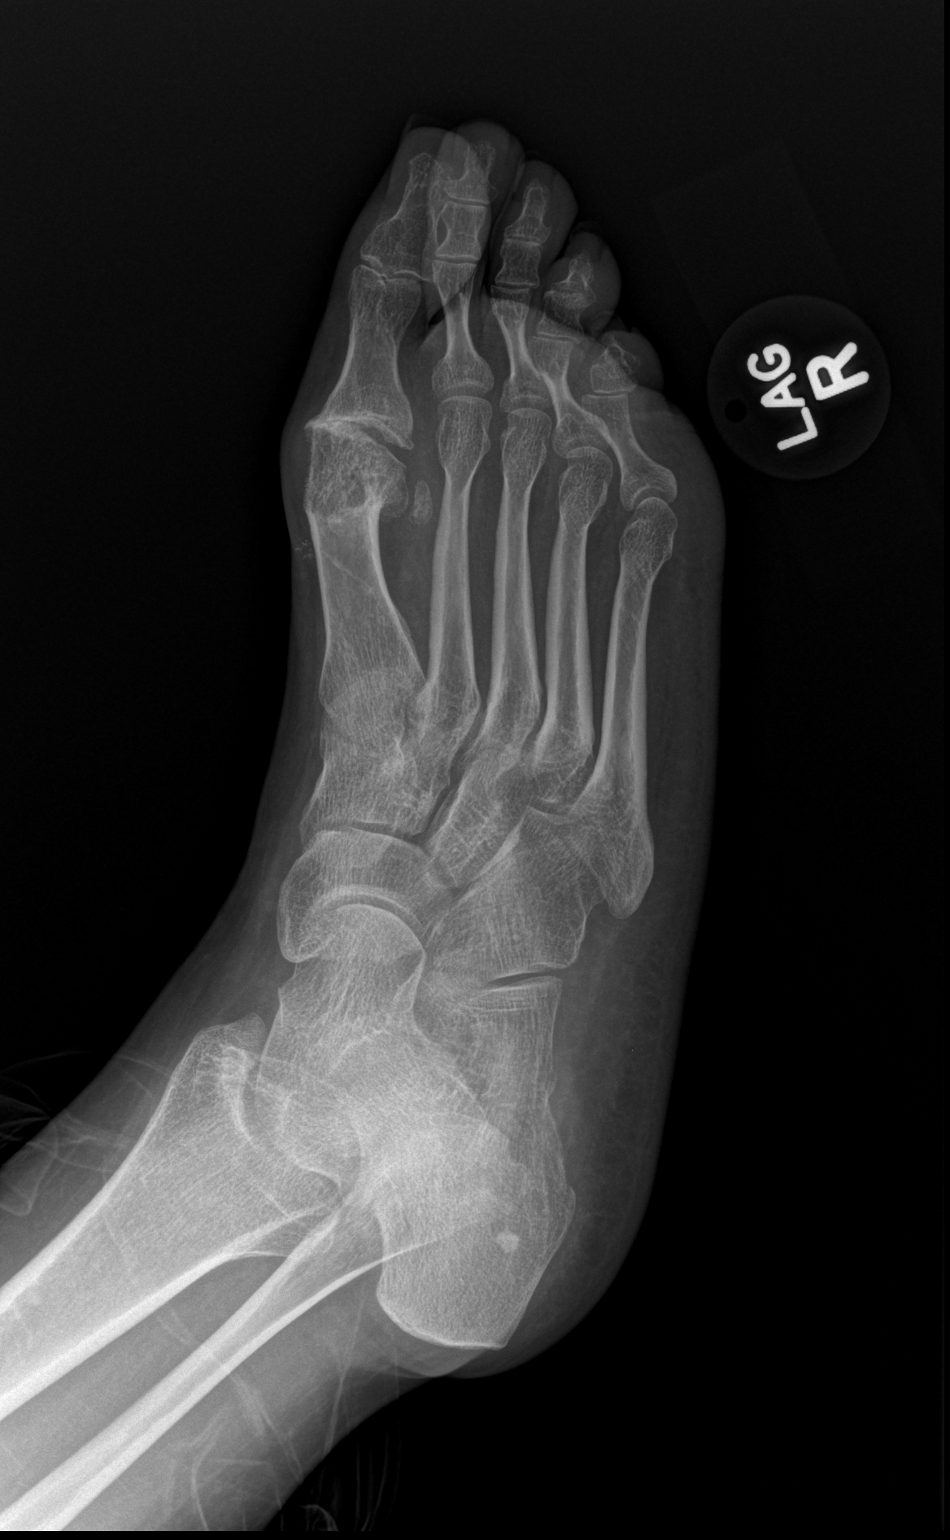

[x foot lat right]
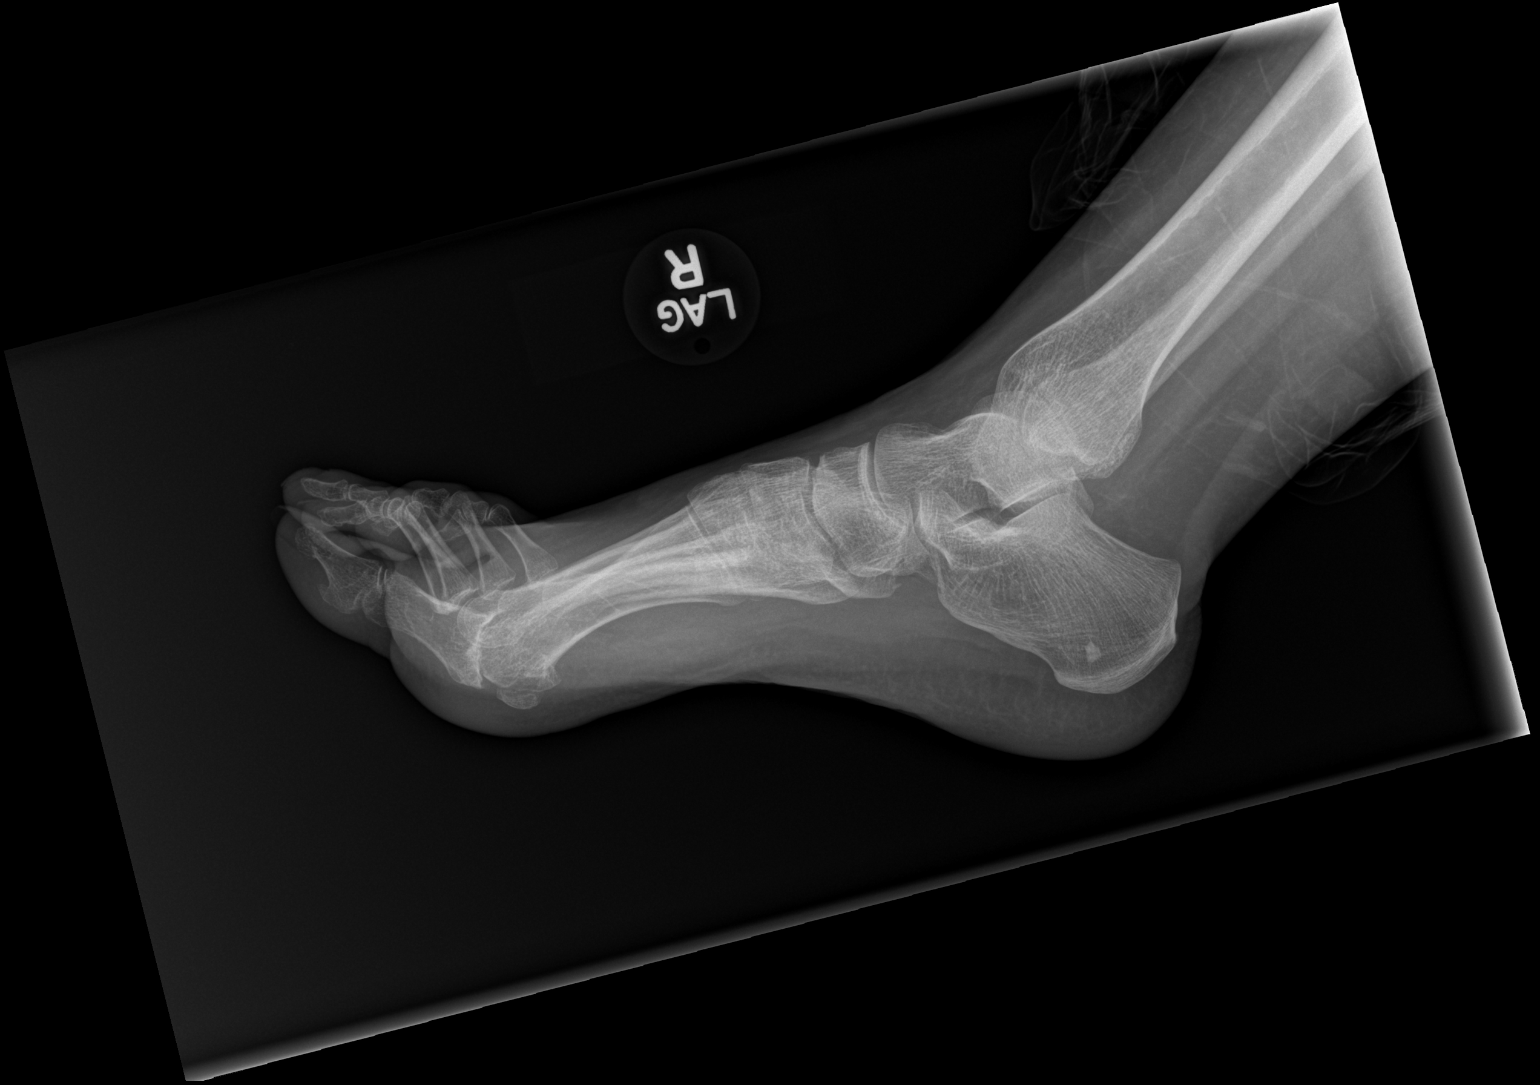

[3 of 3 positions shown; findings below may reference images not displayed]

FINDINGS: There is arthropathy at the first MTP joint which could conceivably
be posttraumatic, but which has a pencil in cup appearance they can
be associated with psoriatic arthropathy. No malalignment at the
Lisfranc joint identified. Small bone island in the calcaneus. No
additional bony abnormality identified. No foreign body or gas in
the soft tissues noted.
IMPRESSION: 1. Scalloped appearance of the base the proximal phalanx great toe
with a tapered and deformed appearance of the head of the first
metatarsal. This could be posttraumatic although psoriatic
arthropathy could cause a similar appearance.
2. No additional significant bony findings. No foreign body or gas
in the soft tissues to further explain the patient's anterior foot
pain.

## 2018-05-03 IMAGING — CR DG CHEST 2V
2 series · 2 of 2 positions shown · non-contrast
Comparison: None.

CLINICAL DATA: Shortness of breath with cough and congestion

EXAM:
CHEST  2 VIEW

[w chest pa]
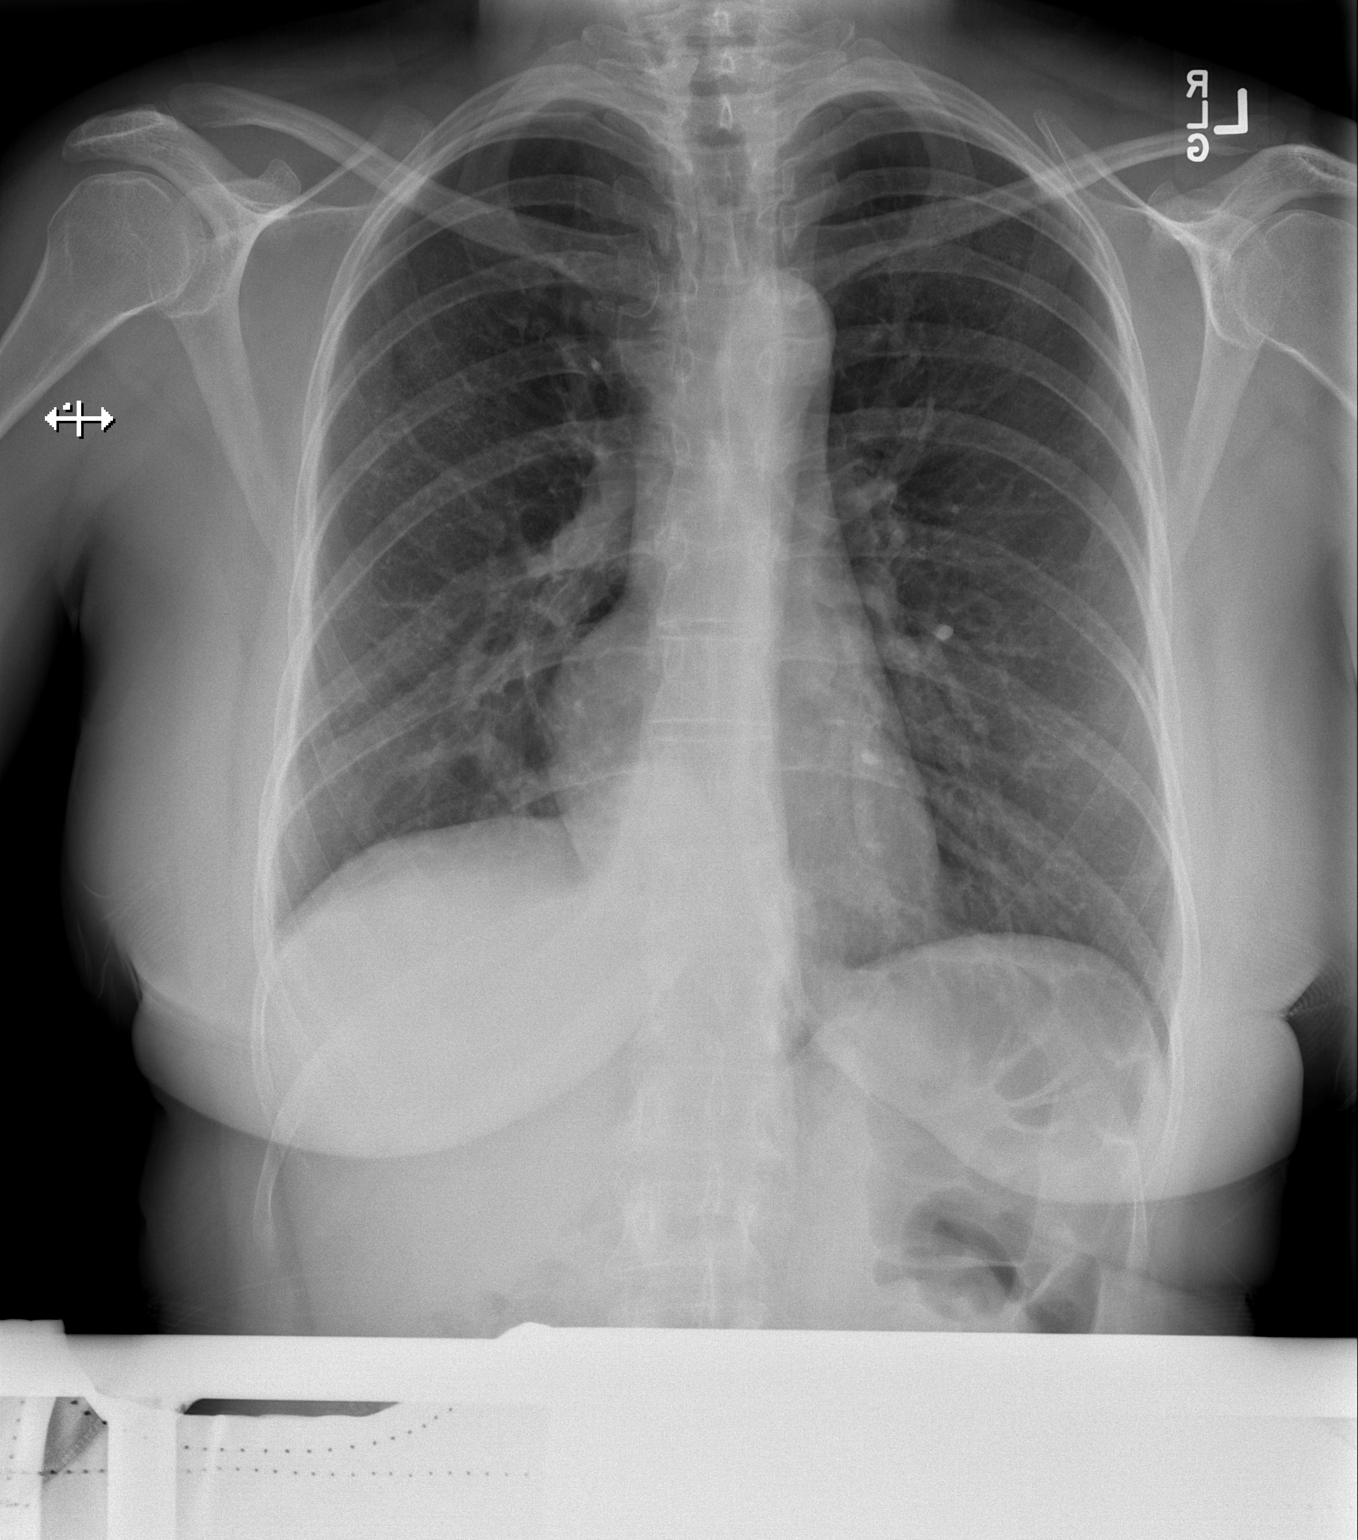

[w chest lat]
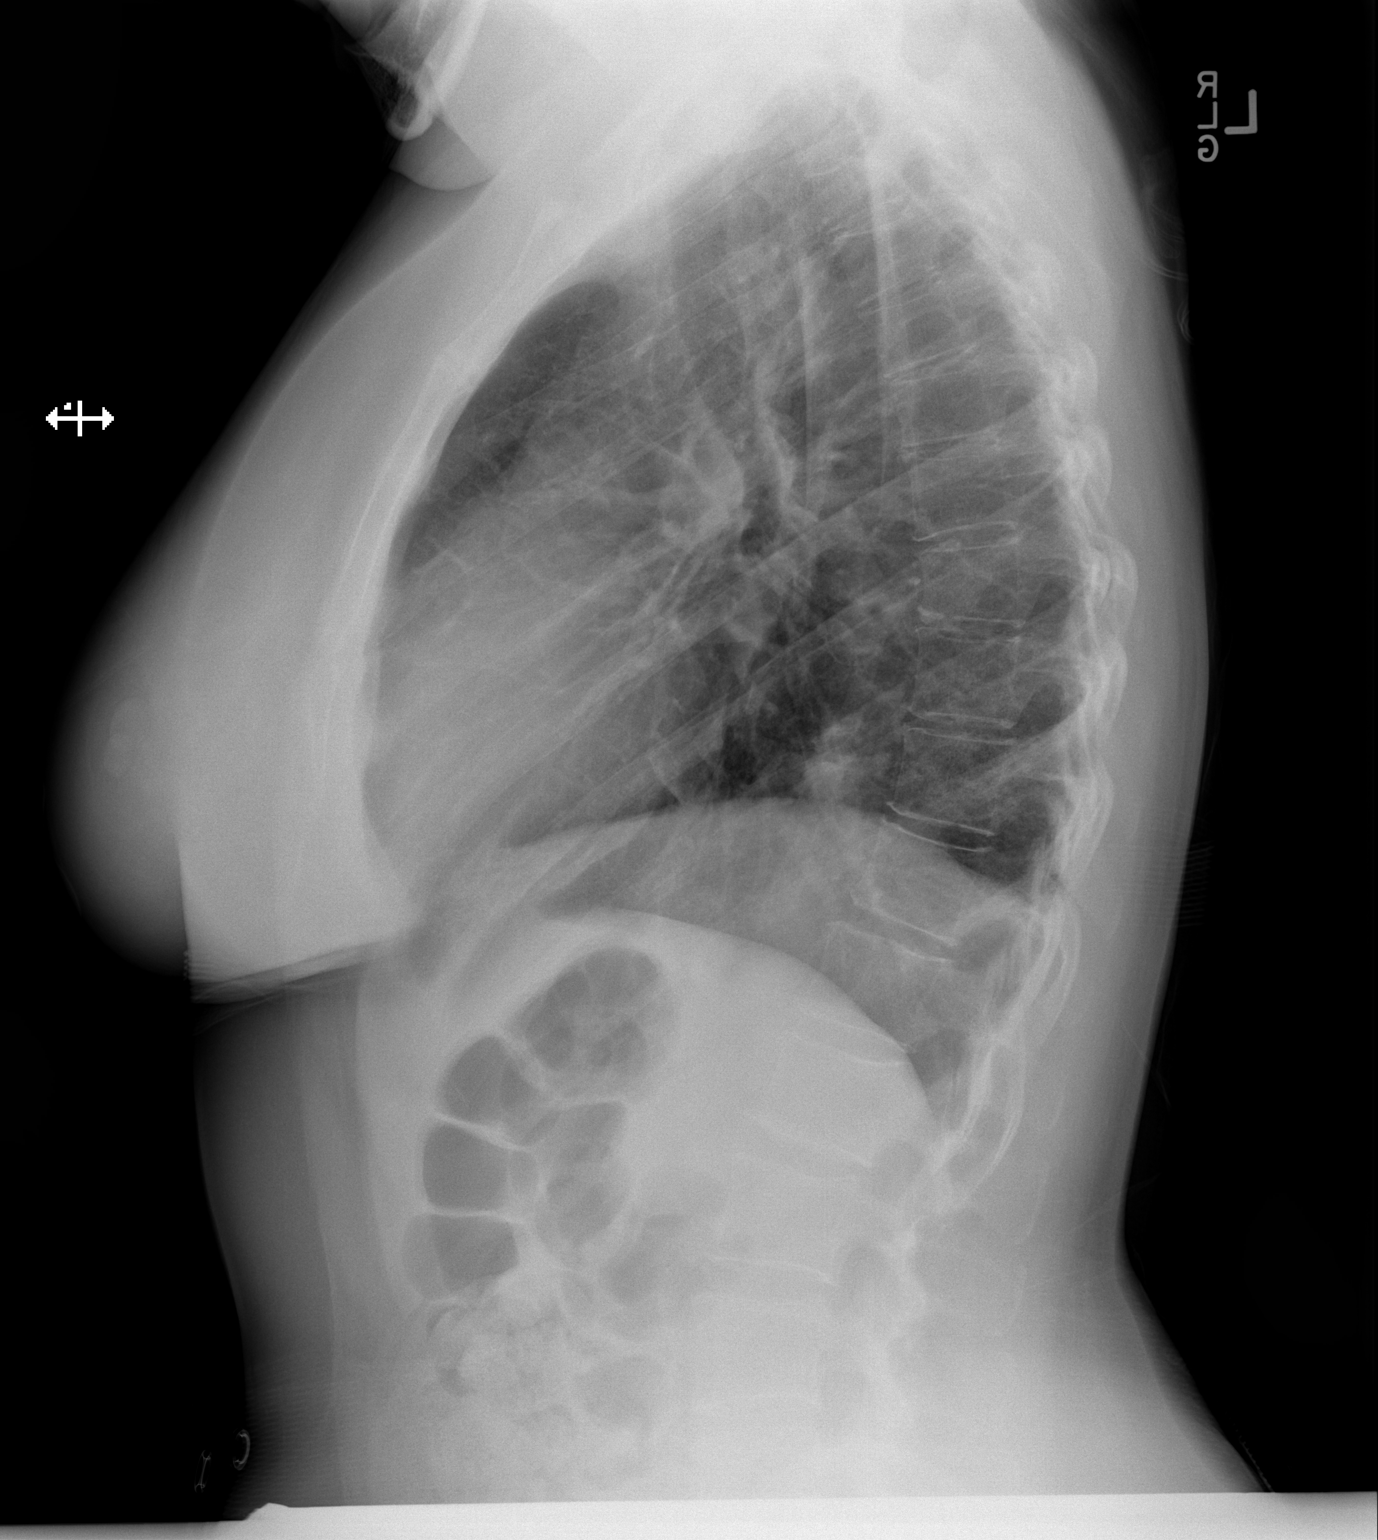

[2 of 2 positions shown; findings below may reference images not displayed]

FINDINGS: There is no edema or consolidation. Heart size and pulmonary
vascularity are normal. No adenopathy. There is a small hiatal
hernia. No bone lesions.
IMPRESSION: Small hiatal hernia.  No edema or consolidation.

## 2018-05-23 IMAGING — US US BREAST*L* LIMITED INC AXILLA
1 series · 7 of 7 positions shown · non-contrast
Comparison: None

CLINICAL DATA: Patient presents for evaluation of red tender area
within the inferior aspect of the left breast.

EXAM:
ULTRASOUND OF THE LEFT BREAST

[Series 1: us breast*left* limited inc axilla · 0.06mm/px · 7 of 7 slices shown]
[im 1/7]
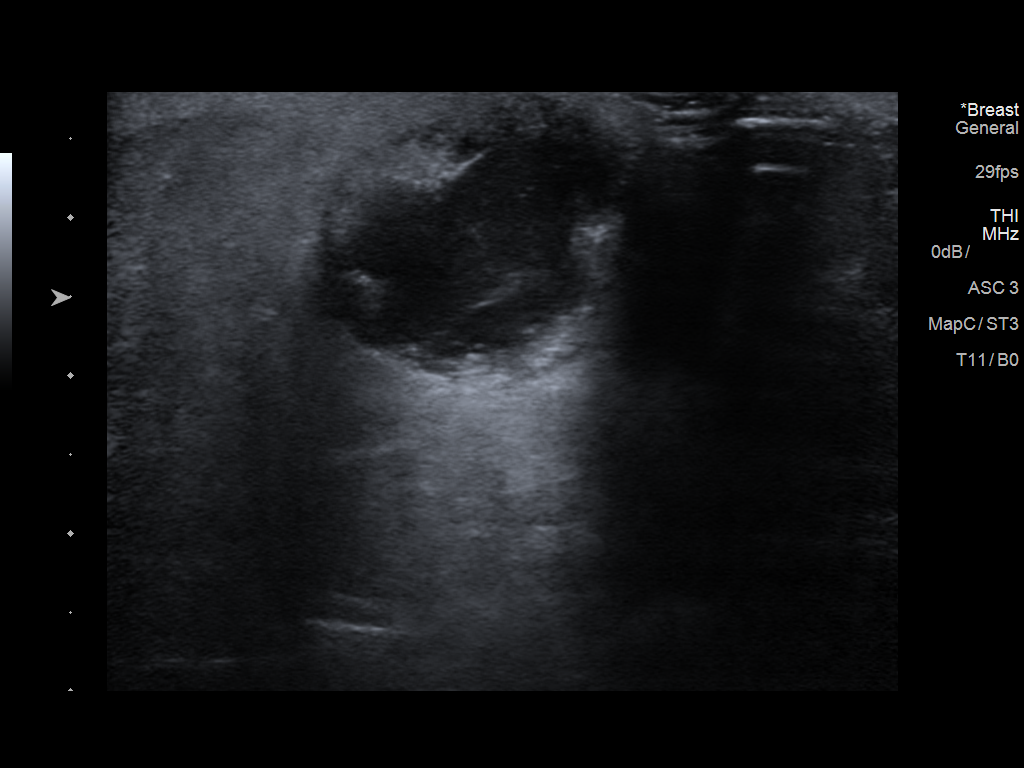
[im 2/7]
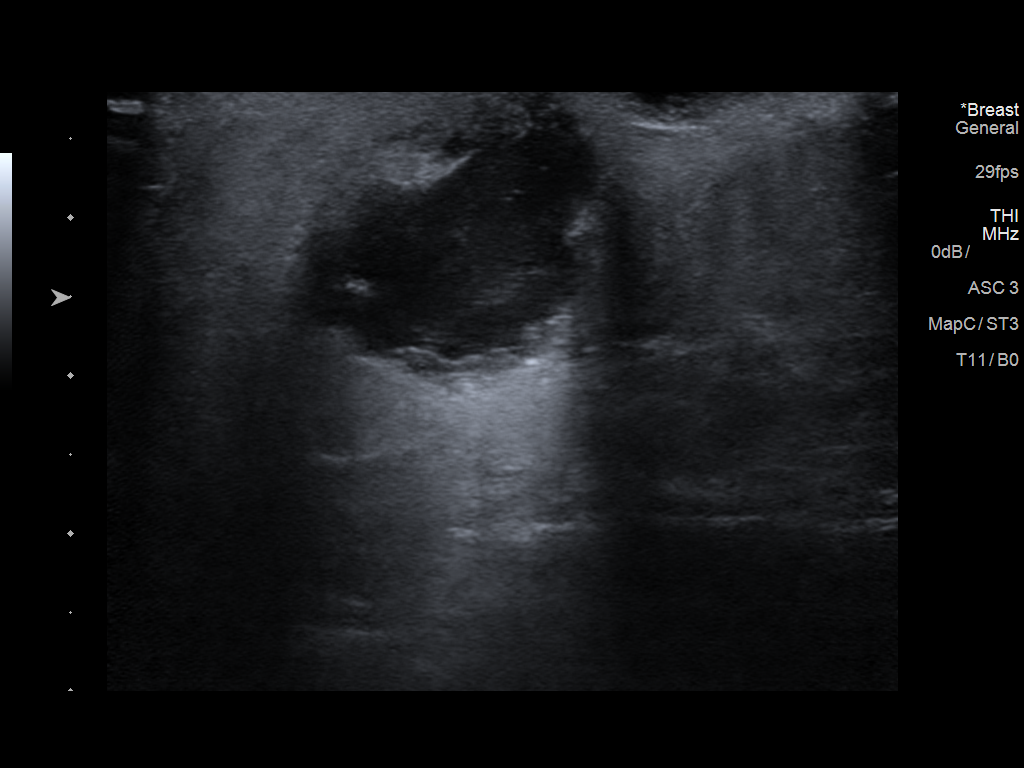
[im 3/7]
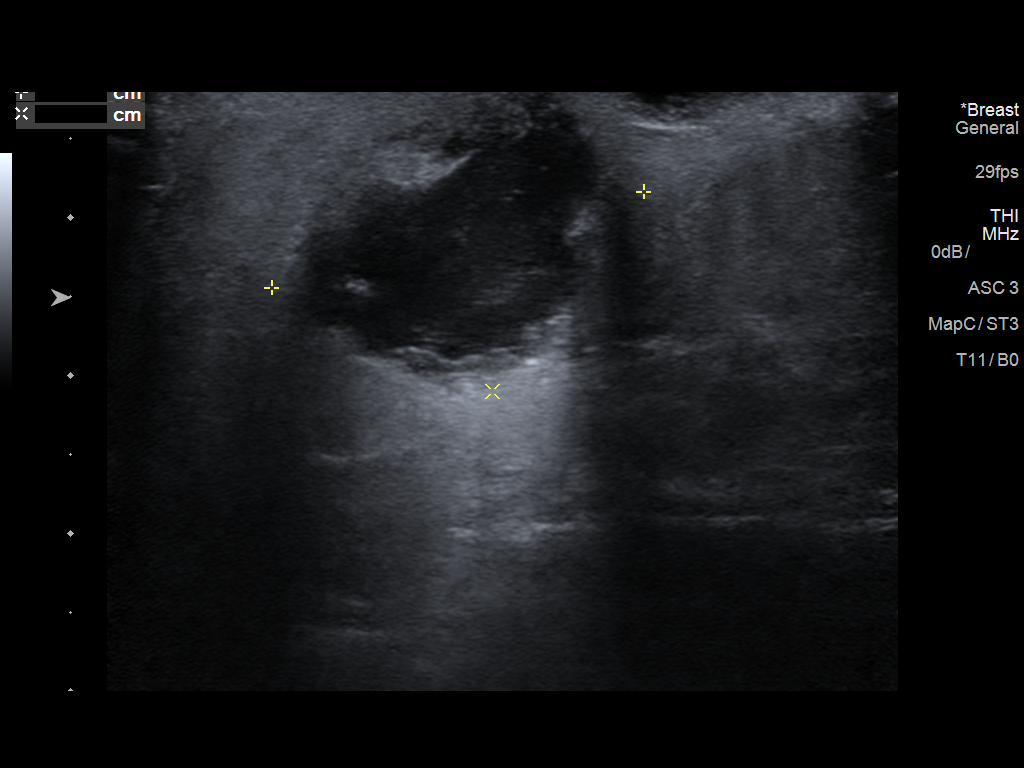
[im 4/7]
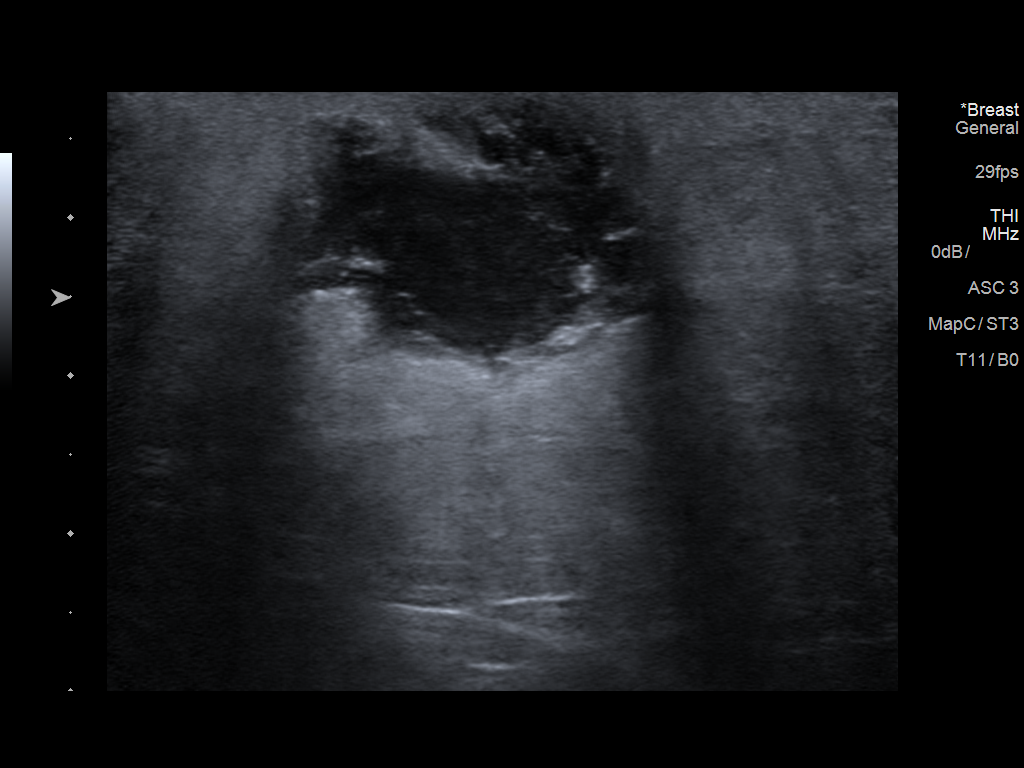
[im 5/7]
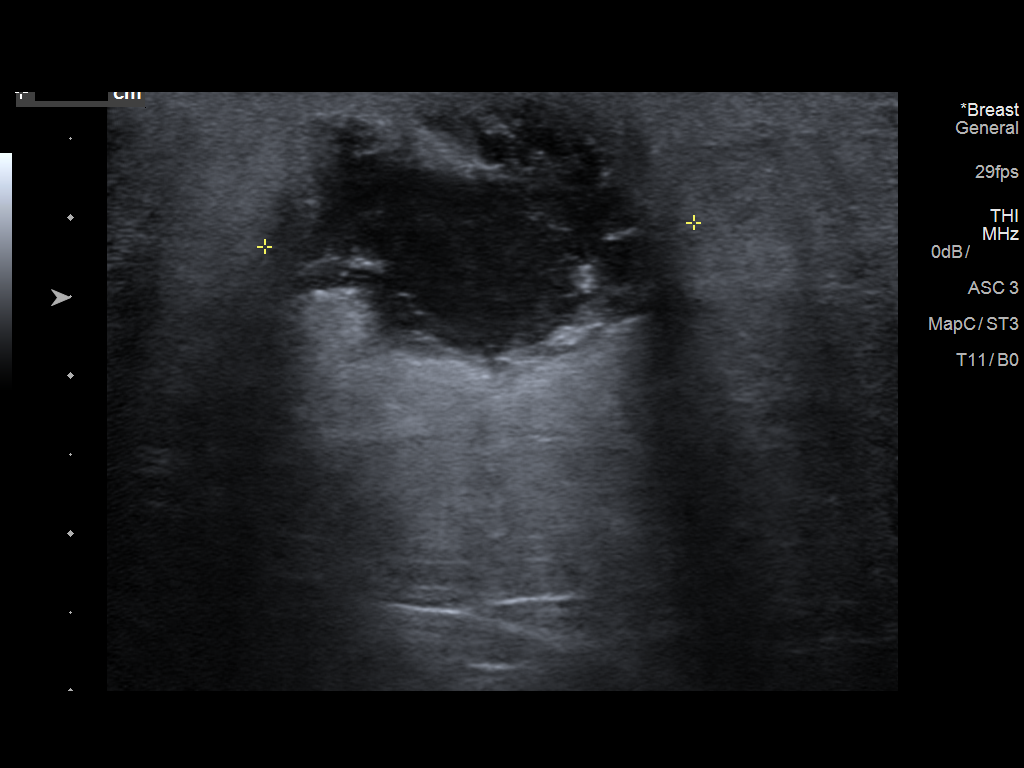
[im 6/7]
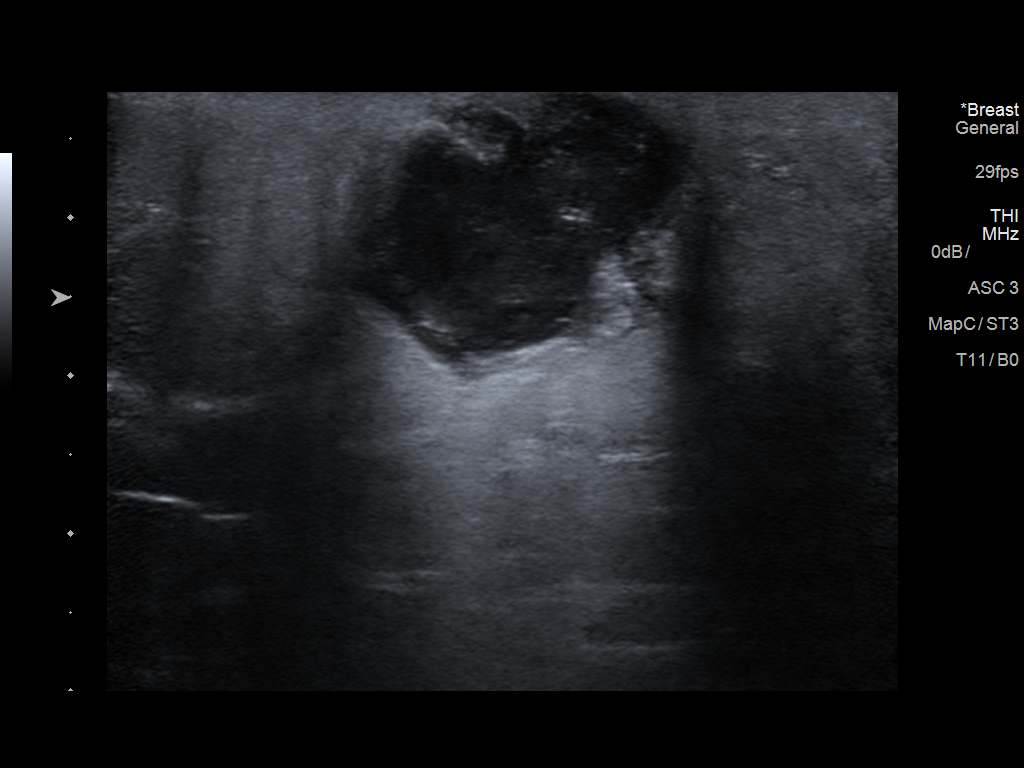
[im 7/7]
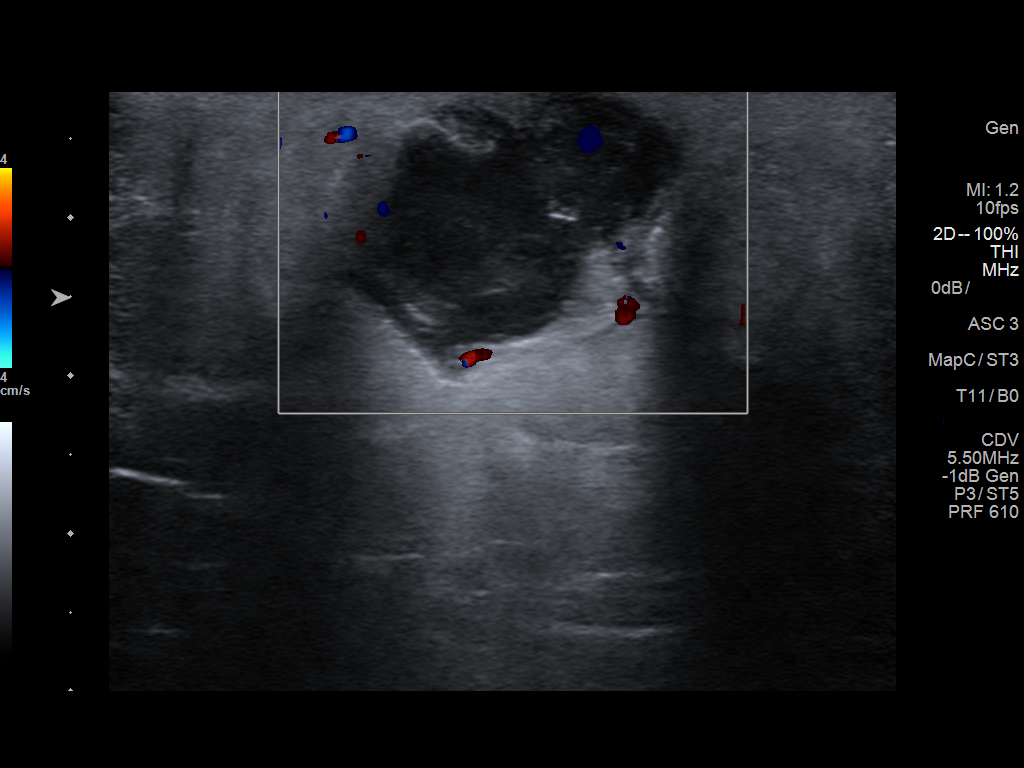

[7 of 7 positions shown; findings below may reference images not displayed]

FINDINGS: Targeted ultrasound is performed, showing a 2.4 x 2.0 x 2.7 cm mixed
echogenicity fluid collection within the lower inner left breast at
the patient reported site of focal tenderness and palpable
abnormality.
IMPRESSION: Mixed echogenicity fluid collection within the lower inner left
breast at the site of concern, likely reflective of a subcutaneous
abscess.

RECOMMENDATION:
Consider percutaneous drainage and antibiotic therapy. Additionally,
recommend short-term follow-up imaging in 3-7 days to assess for
interval decrease in size/resolution of suspected abscess.

I have discussed the findings and recommendations with the patient.
Results were also provided in writing at the conclusion of the
visit. If applicable, a reminder letter will be sent to the patient
regarding the next appointment.

BI-RADS CATEGORY  3: Probably benign.
